# Patient Record
Sex: Female | Born: 1995 | Race: Black or African American | Hispanic: No | Marital: Single | Smoking: Never smoker
Health system: Southern US, Community
[De-identification: ages and names within clinical notes are randomized; demographics above are authoritative.]

## PROBLEM LIST (undated history)

## (undated) ENCOUNTER — Inpatient Hospital Stay (HOSPITAL_COMMUNITY): Payer: Self-pay

## (undated) DIAGNOSIS — O98212 Gonorrhea complicating pregnancy, second trimester: Secondary | ICD-10-CM

## (undated) DIAGNOSIS — J45909 Unspecified asthma, uncomplicated: Secondary | ICD-10-CM

## (undated) DIAGNOSIS — D649 Anemia, unspecified: Secondary | ICD-10-CM

## (undated) HISTORY — PX: WISDOM TOOTH EXTRACTION: SHX21

## (undated) HISTORY — DX: Unspecified asthma, uncomplicated: J45.909

---
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
## 2015-01-16 ENCOUNTER — Ambulatory Visit (INDEPENDENT_AMBULATORY_CARE_PROVIDER_SITE_OTHER): Payer: Medicaid Other

## 2015-01-16 DIAGNOSIS — Z119 Encounter for screening for infectious and parasitic diseases, unspecified: Secondary | ICD-10-CM | POA: Diagnosis not present

## 2015-01-16 NOTE — Progress Notes (Signed)
PPD placed left forearm and Dc'd home with f/up visit for reading.

## 2015-01-19 ENCOUNTER — Ambulatory Visit (INDEPENDENT_AMBULATORY_CARE_PROVIDER_SITE_OTHER): Payer: Medicaid Other

## 2015-01-19 DIAGNOSIS — Z119 Encounter for screening for infectious and parasitic diseases, unspecified: Secondary | ICD-10-CM | POA: Diagnosis not present

## 2015-01-19 LAB — TB SKIN TEST
Induration: 0 mm
TB SKIN TEST: NEGATIVE

## 2015-01-19 NOTE — Progress Notes (Signed)
Pt in for PPD read/ nurse visit only. PPD test read by RN and negative. Pt form filled out by RN and given back to pt. Pt given AVS and left for home with completed form.

## 2015-03-01 ENCOUNTER — Ambulatory Visit: Payer: Medicaid Other | Admitting: Obstetrics

## 2015-03-23 ENCOUNTER — Encounter: Payer: Self-pay | Admitting: Obstetrics

## 2015-03-23 ENCOUNTER — Ambulatory Visit (INDEPENDENT_AMBULATORY_CARE_PROVIDER_SITE_OTHER): Payer: Medicaid Other | Admitting: Obstetrics

## 2015-03-23 VITALS — BP 121/70 | HR 83 | Temp 98.5°F | Ht 61.0 in | Wt 127.0 lb

## 2015-03-23 DIAGNOSIS — Z0000 Encounter for general adult medical examination without abnormal findings: Secondary | ICD-10-CM | POA: Diagnosis not present

## 2015-03-23 DIAGNOSIS — Z01419 Encounter for gynecological examination (general) (routine) without abnormal findings: Secondary | ICD-10-CM

## 2015-03-23 DIAGNOSIS — Z30011 Encounter for initial prescription of contraceptive pills: Secondary | ICD-10-CM

## 2015-03-23 MED ORDER — NORGESTIMATE-ETH ESTRADIOL 0.25-35 MG-MCG PO TABS: 1.0000 | ORAL_TABLET | Freq: Every day | ORAL | Status: DC

## 2015-03-23 NOTE — Patient Instructions (Signed)
Oral Contraception Use Oral contraceptive pills (OCPs) are medicines taken to prevent pregnancy. OCPs work by preventing the ovaries from releasing eggs. The hormones in OCPs also cause the cervical mucus to thicken, preventing the sperm from entering the uterus. The hormones also cause the uterine lining to become thin, not allowing a fertilized egg to attach to the inside of the uterus. OCPs are highly effective when taken exactly as prescribed. However, OCPs do not prevent sexually transmitted diseases (STDs). Safe sex practices, such as using condoms along with an OCP, can help prevent STDs. Before taking OCPs, you may have a physical exam and Pap test. Your health care provider may also order blood tests if necessary. Your health care provider will make sure you are a good candidate for oral contraception. Discuss with your health care provider the possible side effects of the OCP you may be prescribed. When starting an OCP, it can take 2 to 3 months for the body to adjust to the changes in hormone levels in your body.  HOW TO TAKE ORAL CONTRACEPTIVE PILLS Your health care provider may advise you on how to start taking the first cycle of OCPs. Otherwise, you can:   Start on day 1 of your menstrual period. You will not need any backup contraceptive protection with this start time.   Start on the first Sunday after your menstrual period or the day you get your prescription. In these cases, you will need to use backup contraceptive protection for the first week.   Start the pill at any time of your cycle. If you take the pill within 5 days of the start of your period, you are protected against pregnancy right away. In this case, you will not need a backup form of birth control. If you start at any other time of your menstrual cycle, you will need to use another form of birth control for 7 days. If your OCP is the type called a minipill, it will protect you from pregnancy after taking it for 2 days (48  hours). After you have started taking OCPs:   If you forget to take 1 pill, take it as soon as you remember. Take the next pill at the regular time.   If you miss 2 or more pills, call your health care provider because different pills have different instructions for missed doses. Use backup birth control until your next menstrual period starts.   If you use a 28-day pack that contains inactive pills and you miss 1 of the last 7 pills (pills with no hormones), it will not matter. Throw away the rest of the non-hormone pills and start a new pill pack.  No matter which day you start the OCP, you will always start a new pack on that same day of the week. Have an extra pack of OCPs and a backup contraceptive method available in case you miss some pills or lose your OCP pack.  HOME CARE INSTRUCTIONS   Do not smoke.   Always use a condom to protect against STDs. OCPs do not protect against STDs.   Use a calendar to mark your menstrual period days.   Read the information and directions that came with your OCP. Talk to your health care provider if you have questions.  SEEK MEDICAL CARE IF:   You develop nausea and vomiting.   You have abnormal vaginal discharge or bleeding.   You develop a rash.   You miss your menstrual period.   You are losing   your hair.   You need treatment for mood swings or depression.   You get dizzy when taking the OCP.   You develop acne from taking the OCP.   You become pregnant.  SEEK IMMEDIATE MEDICAL CARE IF:   You develop chest pain.   You develop shortness of breath.   You have an uncontrolled or severe headache.   You develop numbness or slurred speech.   You develop visual problems.   You develop pain, redness, and swelling in the legs.  Document Released: 05/30/2011 Document Revised: 10/25/2013 Document Reviewed: 11/29/2012 ExitCare Patient Information 2015 [employer]. This information is not intended to replace  advice given to you by your health care provider. Make sure you discuss any questions you have with your health care provider.  

## 2015-03-23 NOTE — Progress Notes (Signed)
Subjective:        Catherine Golden is a 19 y.o. female here for a routine exam.  Current complaints: none.    Personal health questionnaire:  Is patient Ashkenazi Jewish, have a family history of breast and/or ovarian cancer: no Is there a family history of uterine cancer diagnosed at age < 64, gastrointestinal cancer, urinary tract cancer, family member who is a Personnel officer syndrome-associated carrier: no Is the patient overweight and hypertensive, family history of diabetes, personal history of gestational diabetes, preeclampsia or PCOS: no Is patient over 77, have PCOS,  family history of premature CHD under age 25, diabetes, smoke, have hypertension or peripheral artery disease:  no At any time, has a partner hit, kicked or otherwise hurt or frightened you?: no Over the past 2 weeks, have you felt down, depressed or hopeless?: no Over the past 2 weeks, have you felt little interest or pleasure in doing things?:no   Gynecologic History Patient's last menstrual period was 03/01/2015 (exact date). Contraception: none Last Pap: n/a. Results were: n/a Last mammogram: n/a. Results were: n/a  Obstetric History OB History  Gravida Para Term Preterm AB SAB TAB Ectopic Multiple Living         Past Medical History  Diagnosis Date  . Asthma     Past Surgical History  Procedure Laterality Date  . Wisdom tooth extraction       Current outpatient prescriptions:  .  albuterol (PROVENTIL HFA;VENTOLIN HFA) 108 (90 BASE) MCG/ACT inhaler, Inhale 2 puffs into the lungs every 4 (four) hours as needed for wheezing., Disp: 2 Inhaler, Rfl: 11 .  azithromycin (ZITHROMAX) 500 MG tablet, Take two tablets once (Patient not taking: Reported on 03/23/2015), Disp: 2 tablet, Rfl: 0 .  fluconazole (DIFLUCAN) 150 MG tablet, Take 1 tab (150 mg) today and one tab (150 mg) in 3 days (Patient not taking: Reported on 03/23/2015), Disp: 2 tablet, Rfl: 0 .  norgestimate-ethinyl estradiol  (ORTHO-CYCLEN,SPRINTEC,PREVIFEM) 0.25-35 MG-MCG tablet, Take 1 tablet by mouth daily., Disp: 1 Package, Rfl: 11 No Known Allergies  Social History  Substance Use Topics  . Smoking status: Passive Smoke Exposure - Never Smoker  . Smokeless tobacco: Never Used  . Alcohol Use: No    Family History  Problem Relation Age of Onset  . Hypertension    . Diabetes Mellitus II    . Cystic fibrosis    . Hypertension Father   . Diabetes Paternal Grandmother   . Hypertension Paternal Grandmother   . Cancer Neg Hx       Review of Systems  Constitutional: negative for fatigue and weight loss Respiratory: negative for cough and wheezing Cardiovascular: negative for chest pain, fatigue and palpitations Gastrointestinal: negative for abdominal pain and change in bowel habits Musculoskeletal:negative for myalgias Neurological: negative for gait problems and tremors Behavioral/Psych: negative for abusive relationship, depression Endocrine: negative for temperature intolerance   Genitourinary:negative for abnormal menstrual periods, genital lesions, hot flashes, sexual problems and vaginal discharge Integument/breast: negative for breast lump, breast tenderness, nipple discharge and skin lesion(s)    Objective:       BP 121/70 mmHg  Pulse 83  Temp(Src) 98.5 F (36.9 C)  Ht  (1.549 m)  Wt 127 lb (57.607 kg)  BMI 24.01 kg/m2  LMP 03/01/2015 (Exact Date) General:   alert  Skin:   no rash or abnormalities  Lungs:   clear to auscultation bilaterally  Heart:   regular  rate and rhythm, S1, S2 normal, no murmur, click, rub or gallop  Breasts:   normal without suspicious masses, skin or nipple changes or axillary nodes  Abdomen:  normal findings: no organomegaly, soft, non-tender and no hernia  Pelvis:  External genitalia: normal general appearance Urinary system: urethral meatus normal and bladder without fullness, nontender Vaginal: normal without tenderness, induration or  masses Cervix: normal appearance Adnexa: normal bimanual exam Uterus: anteverted and non-tender, normal size   Lab Review Urine pregnancy test Labs reviewed no Radiologic studies reviewed no    Assessment:    Healthy female exam.    Plan:    Education reviewed: safe sex/STD prevention and weight bearing exercise. Contraception: OCP (estrogen/progesterone). Follow up in: 1 year.   Meds ordered this encounter  Medications  . norgestimate-ethinyl estradiol (ORTHO-CYCLEN,SPRINTEC,PREVIFEM) 0.25-35 MG-MCG tablet    Sig: Take 1 tablet by mouth daily.    Dispense:  1 Package    Refill:  11   Orders Placed This Encounter  Procedures  . SureSwab, Vaginosis/Vaginitis Plus

## 2015-03-24 ENCOUNTER — Encounter: Payer: Self-pay | Admitting: Obstetrics

## 2015-03-27 ENCOUNTER — Other Ambulatory Visit: Payer: Self-pay | Admitting: Obstetrics

## 2015-03-27 DIAGNOSIS — B373 Candidiasis of vulva and vagina: Secondary | ICD-10-CM

## 2015-03-27 DIAGNOSIS — B3731 Acute candidiasis of vulva and vagina: Secondary | ICD-10-CM

## 2015-03-27 LAB — SURESWAB, VAGINOSIS/VAGINITIS PLUS
Atopobium vaginae: NOT DETECTED Log (cells/mL)
C. GLABRATA, DNA: NOT DETECTED
C. PARAPSILOSIS, DNA: NOT DETECTED
C. TRACHOMATIS RNA, TMA: NOT DETECTED
C. TROPICALIS, DNA: NOT DETECTED
C. albicans, DNA: DETECTED — AB
Gardnerella vaginalis: 5.4 Log (cells/mL)
LACTOBACILLUS SPECIES: 7.8 Log (cells/mL)
MEGASPHAERA SPECIES: NOT DETECTED Log (cells/mL)
N. GONORRHOEAE RNA, TMA: NOT DETECTED
T. vaginalis RNA, QL TMA: NOT DETECTED

## 2015-03-27 MED ORDER — FLUCONAZOLE 150 MG PO TABS: 150.0000 mg | ORAL_TABLET | Freq: Once | ORAL | Status: DC

## 2015-04-03 ENCOUNTER — Ambulatory Visit: Payer: Medicaid Other | Admitting: Obstetrics

## 2015-04-04 ENCOUNTER — Ambulatory Visit: Payer: Medicaid Other | Admitting: Obstetrics

## 2015-05-09 ENCOUNTER — Ambulatory Visit: Payer: Medicaid Other | Admitting: Obstetrics

## 2015-05-29 ENCOUNTER — Other Ambulatory Visit (INDEPENDENT_AMBULATORY_CARE_PROVIDER_SITE_OTHER): Payer: Medicaid Other | Admitting: *Deleted

## 2015-05-29 ENCOUNTER — Encounter: Payer: Self-pay | Admitting: Obstetrics

## 2015-05-29 VITALS — BP 119/74 | HR 85 | Wt 125.0 lb

## 2015-05-29 DIAGNOSIS — Z3201 Encounter for pregnancy test, result positive: Secondary | ICD-10-CM | POA: Diagnosis not present

## 2015-05-29 DIAGNOSIS — Z3200 Encounter for pregnancy test, result unknown: Secondary | ICD-10-CM

## 2015-05-29 MED ORDER — VITAFOL GUMMIES 3.33-0.333-34.8 MG PO CHEW: 3.0000 | CHEWABLE_TABLET | Freq: Every day | ORAL | Status: DC

## 2015-06-01 LAB — POCT URINE PREGNANCY: PREG TEST UR: POSITIVE — AB

## 2015-06-01 NOTE — Progress Notes (Signed)
Pt in office for UPT, confirm pregnancy.  Pt states that she had positive at home test. UPT in office is positive.  Pt states her LMP is 04/14/15, dating her at 4557w3d.  Pt made aware of dating and scheduling NOB appt.  Pt made aware that NOB are usually scheduled around 10-12 weeks.  Pt states that she is having some nausea.  Pt was advised to try eating small snack size meals. Pt was advised to contact office if nausea continues, worsens or she begins vomitting.  Pt has been advised of signs and symptoms that [facility]   Pt states understanding.

## 2015-06-25 NOTE — L&D Delivery Note (Signed)
Delivery Note At 7:51 AM a viable female was delivered via Vaginal, Spontaneous Delivery (Presentation: ROA  ).  APGAR: 9, 9; weight 7 lb 5.1 oz (3320 g).   Placenta status: spont , intact .  Cord:  3 vessel Anesthesia:  1% lidocaine Episiotomy: None Lacerations: 1st degree Suture Repair: 3.0 monocryl; 3 interrupted along right labia Est. Blood Loss (mL): 425  Given cytotec PO for some expressed clots; mod bldg. Fundus firm, bladder emptied of 300cc urine, and Pitocin bolus infusing.  Mom to postpartum.  Baby to Couplet care / Skin to Skin.  Cam Hai CNM 01/20/2016, 10:04 AM

## 2015-07-03 ENCOUNTER — Ambulatory Visit (INDEPENDENT_AMBULATORY_CARE_PROVIDER_SITE_OTHER): Payer: Medicaid Other | Admitting: Obstetrics

## 2015-07-03 ENCOUNTER — Encounter: Payer: Self-pay | Admitting: Obstetrics

## 2015-07-03 VITALS — BP 117/73 | HR 87 | Temp 97.9°F | Wt 118.4 lb

## 2015-07-03 DIAGNOSIS — Z3401 Encounter for supervision of normal first pregnancy, first trimester: Secondary | ICD-10-CM

## 2015-07-03 DIAGNOSIS — Z3490 Encounter for supervision of normal pregnancy, unspecified, unspecified trimester: Secondary | ICD-10-CM

## 2015-07-03 LAB — POCT URINALYSIS DIPSTICK
Bilirubin, UA: NEGATIVE
Blood, UA: NEGATIVE
GLUCOSE UA: NEGATIVE
KETONES UA: NEGATIVE
Nitrite, UA: NEGATIVE
Protein, UA: 1
SPEC GRAV UA: 1.02
UROBILINOGEN UA: NEGATIVE
pH, UA: 5

## 2015-07-03 NOTE — Progress Notes (Signed)
Patient has had an increase in discharge

## 2015-07-03 NOTE — Addendum Note (Signed)
Addended by: Elby BeckPAUL, JANE F on: 07/03/2015 10:45 AM   Modules accepted: Orders

## 2015-07-03 NOTE — Progress Notes (Signed)
Subjective:    Catherine Golden is being seen today for her first obstetrical visit.  This is not a planned pregnancy. She is at Unknown gestation. Her obstetrical history is significant for adolescence. Relationship with FOB: significant other, not living together. Patient does intend to breast feed. Pregnancy history fully reviewed.  The information documented in the HPI was reviewed and verified.  Menstrual History: OB History    Gravida Para Term Preterm AB TAB SAB Ectopic Multiple Living   1 0 0 0 0 0 0 0 0 0        Patient's last menstrual period was 04/14/2015 (exact date).    Past Medical History  Diagnosis Date  . Asthma     Past Surgical History  Procedure Laterality Date  . Wisdom tooth extraction       (Not in a hospital admission) No Known Allergies  Social History  Substance Use Topics  . Smoking status: Passive Smoke Exposure - Never Smoker  . Smokeless tobacco: Never Used  . Alcohol Use: No    Family History  Problem Relation Age of Onset  . Hypertension    . Diabetes Mellitus II    . Cystic fibrosis    . Hypertension Father   . Diabetes Paternal Grandmother   . Hypertension Paternal Grandmother   . Cancer Neg Hx      Review of Systems Constitutional: negative for weight loss Gastrointestinal: negative for vomiting Genitourinary:negative for genital lesions and vaginal discharge and dysuria Musculoskeletal:negative for back pain Behavioral/Psych: negative for abusive relationship, depression, illegal drug usage and tobacco use    Objective:    BP 117/73 mmHg  Pulse 87  Temp(Src) 97.9 F (36.6 C)  Wt 118 lb 6.4 oz (53.706 kg)  LMP 04/14/2015 (Exact Date) General Appearance:    Alert, cooperative, no distress, appears stated age  Head:    Normocephalic, without obvious abnormality, atraumatic  Eyes:    PERRL, conjunctiva/corneas clear, EOM's intact, fundi    benign, both eyes  Ears:    Normal TM's and external ear canals, both ears   Nose:   Nares normal, septum midline, mucosa normal, no drainage    or sinus tenderness  Throat:   Lips, mucosa, and tongue normal; teeth and gums normal  Neck:   Supple, symmetrical, trachea midline, no adenopathy;    thyroid:  no enlargement/tenderness/nodules; no carotid   bruit or JVD  Back:     Symmetric, no curvature, ROM normal, no CVA tenderness  Lungs:     Clear to auscultation bilaterally, respirations unlabored  Chest Wall:    No tenderness or deformity   Heart:    Regular rate and rhythm, S1 and S2 normal, no murmur, rub   or gallop  Breast Exam:    No tenderness, masses, or nipple abnormality  Abdomen:     Soft, non-tender, bowel sounds active all four quadrants,    no masses, no organomegaly  Genitalia:    Normal female without lesion, discharge or tenderness  Extremities:   Extremities normal, atraumatic, no cyanosis or edema  Pulses:   2+ and symmetric all extremities  Skin:   Skin color, texture, turgor normal, no rashes or lesions  Lymph nodes:   Cervical, supraclavicular, and axillary nodes normal  Neurologic:   CNII-XII intact, normal strength, sensation and reflexes    throughout      Lab Review Urine pregnancy test Labs reviewed yes Radiologic studies reviewed yes Assessment:    Pregnancy at Unknown weeks  Plan:      Prenatal vitamins.  Counseling provided regarding continued use of seat belts, cessation of alcohol consumption, smoking or use of illicit drugs; infection precautions i.e., influenza/TDAP immunizations, toxoplasmosis,CMV, parvovirus, listeria and varicella; workplace safety, exercise during pregnancy; routine dental care, safe medications, sexual activity, hot tubs, saunas, pools, travel, caffeine use, fish and methlymercury, potential toxins, hair treatments, varicose veins Weight gain recommendations per IOM guidelines reviewed: underweight/BMI< 18.5--> gain 28 - 40 lbs; normal weight/BMI 18.5 - 24.9--> gain 25 - 35 lbs; overweight/BMI 25 -  29.9--> gain 15 - 25 lbs; obese/BMI >30->gain  11 - 20 lbs Problem list reviewed and updated. FIRST/CF mutation testing/NIPT/QUAD SCREEN/fragile X/Ashkenazi Jewish population testing/Spinal muscular atrophy discussed: requested. Role of ultrasound in pregnancy discussed; fetal survey: requested. Amniocentesis discussed: not indicated. VBAC calculator score: VBAC consent form provided No orders of the defined types were placed in this encounter.   Orders Placed This Encounter  Procedures  . Culture, OB Urine  . GC/Chlamydia Probe Amp  . Obstetric panel  . HIV antibody  . Hemoglobinopathy evaluation  . Varicella zoster antibody, IgG  . VITAMIN D 25 Hydroxy (Vit-D Deficiency, Fractures)  . POCT urinalysis dipstick    Follow up in 4 weeks.

## 2015-07-04 ENCOUNTER — Other Ambulatory Visit: Payer: Self-pay | Admitting: *Deleted

## 2015-07-04 ENCOUNTER — Encounter: Payer: Self-pay | Admitting: Obstetrics

## 2015-07-04 DIAGNOSIS — Z3200 Encounter for pregnancy test, result unknown: Secondary | ICD-10-CM

## 2015-07-04 LAB — OBSTETRIC PANEL
Antibody Screen: NEGATIVE
BASOS PCT: 0 % (ref 0–1)
Basophils Absolute: 0 10*3/uL (ref 0.0–0.1)
Eosinophils Absolute: 0 10*3/uL (ref 0.0–0.7)
Eosinophils Relative: 1 % (ref 0–5)
HCT: 32.6 % — ABNORMAL LOW (ref 36.0–46.0)
HEP B S AG: NEGATIVE
Hemoglobin: 11.1 g/dL — ABNORMAL LOW (ref 12.0–15.0)
LYMPHS ABS: 1.1 10*3/uL (ref 0.7–4.0)
LYMPHS PCT: 26 % (ref 12–46)
MCH: 26.2 pg (ref 26.0–34.0)
MCHC: 34 g/dL (ref 30.0–36.0)
MCV: 76.9 fL — ABNORMAL LOW (ref 78.0–100.0)
MONO ABS: 0.3 10*3/uL (ref 0.1–1.0)
MPV: 8.7 fL (ref 8.6–12.4)
Monocytes Relative: 6 % (ref 3–12)
NEUTROS ABS: 2.9 10*3/uL (ref 1.7–7.7)
Neutrophils Relative %: 67 % (ref 43–77)
PLATELETS: 383 10*3/uL (ref 150–400)
RBC: 4.24 MIL/uL (ref 3.87–5.11)
RDW: 20.8 % — AB (ref 11.5–15.5)
Rh Type: POSITIVE
Rubella: 4.42 Index — ABNORMAL HIGH (ref <0.90)
WBC: 4.4 10*3/uL (ref 4.0–10.5)

## 2015-07-04 LAB — VARICELLA ZOSTER ANTIBODY, IGG: Varicella IgG: 171.4 Index — ABNORMAL HIGH (ref <135.00)

## 2015-07-04 LAB — HIV ANTIBODY (ROUTINE TESTING W REFLEX): HIV 1&2 Ab, 4th Generation: NONREACTIVE

## 2015-07-04 LAB — CULTURE, OB URINE
Colony Count: NO GROWTH
Organism ID, Bacteria: NO GROWTH

## 2015-07-04 LAB — VITAMIN D 25 HYDROXY (VIT D DEFICIENCY, FRACTURES): Vit D, 25-Hydroxy: 14 ng/mL — ABNORMAL LOW (ref 30–100)

## 2015-07-04 MED ORDER — VITAFOL GUMMIES 3.33-0.333-34.8 MG PO CHEW
3.0000 | CHEWABLE_TABLET | Freq: Every day | ORAL | Status: DC
Start: 2015-07-04 — End: 2015-09-05

## 2015-07-05 LAB — HEMOGLOBINOPATHY EVALUATION
HGB A: 97.9 % — AB (ref 96.8–97.8)
Hemoglobin Other: 0 %
Hgb A2 Quant: 2.1 % — ABNORMAL LOW (ref 2.2–3.2)
Hgb F Quant: 0 % (ref 0.0–2.0)
Hgb S Quant: 0 %

## 2015-07-05 NOTE — Progress Notes (Signed)
See other encounter.

## 2015-07-07 ENCOUNTER — Other Ambulatory Visit: Payer: Self-pay | Admitting: Obstetrics

## 2015-07-07 DIAGNOSIS — B9689 Other specified bacterial agents as the cause of diseases classified elsewhere: Secondary | ICD-10-CM

## 2015-07-07 DIAGNOSIS — A5602 Chlamydial vulvovaginitis: Secondary | ICD-10-CM

## 2015-07-07 DIAGNOSIS — A5609 Other chlamydial infection of lower genitourinary tract: Principal | ICD-10-CM

## 2015-07-07 DIAGNOSIS — B373 Candidiasis of vulva and vagina: Secondary | ICD-10-CM

## 2015-07-07 DIAGNOSIS — B3731 Acute candidiasis of vulva and vagina: Secondary | ICD-10-CM

## 2015-07-07 DIAGNOSIS — N760 Acute vaginitis: Secondary | ICD-10-CM

## 2015-07-07 LAB — SURESWAB, VAGINOSIS/VAGINITIS PLUS
ATOPOBIUM VAGINAE: 7.2 Log (cells/mL)
BV CATEGORY: UNDETERMINED — AB
C. TROPICALIS, DNA: NOT DETECTED
C. albicans, DNA: DETECTED — AB
C. glabrata, DNA: NOT DETECTED
C. parapsilosis, DNA: NOT DETECTED
C. trachomatis RNA, TMA: DETECTED — AB
Gardnerella vaginalis: >8 Log (cells/mL)
LACTOBACILLUS SPECIES: 5.3 Log (cells/mL)
N. gonorrhoeae RNA, TMA: NOT DETECTED
T. VAGINALIS RNA, QL TMA: NOT DETECTED

## 2015-07-07 MED ORDER — CEFIXIME 400 MG PO TABS: 400.0000 mg | ORAL_TABLET | Freq: Every day | ORAL | Status: DC

## 2015-07-07 MED ORDER — AZITHROMYCIN 250 MG PO TABS: 1000.0000 mg | ORAL_TABLET | Freq: Once | ORAL | Status: DC

## 2015-07-07 MED ORDER — TERCONAZOLE 0.4 % VA CREA: 1.0000 | TOPICAL_CREAM | Freq: Every day | VAGINAL | Status: DC

## 2015-07-07 MED ORDER — METRONIDAZOLE 500 MG PO TABS: 500.0000 mg | ORAL_TABLET | Freq: Two times a day (BID) | ORAL | Status: DC

## 2015-07-12 ENCOUNTER — Other Ambulatory Visit: Payer: Self-pay | Admitting: *Deleted

## 2015-07-12 ENCOUNTER — Telehealth: Payer: Self-pay | Admitting: *Deleted

## 2015-07-12 DIAGNOSIS — B9689 Other specified bacterial agents as the cause of diseases classified elsewhere: Secondary | ICD-10-CM

## 2015-07-12 DIAGNOSIS — N760 Acute vaginitis: Principal | ICD-10-CM

## 2015-07-12 MED ORDER — METRONIDAZOLE 0.75 % VA GEL: 1.0000 | Freq: Every day | VAGINAL | Status: DC

## 2015-07-12 NOTE — Telephone Encounter (Signed)
Patient can not tolerate the metronidazole and she wants to take something else. Will send vaginal treatment for her- patient will finish the yeast treatment-then do the BV treatment.

## 2015-07-31 ENCOUNTER — Ambulatory Visit (INDEPENDENT_AMBULATORY_CARE_PROVIDER_SITE_OTHER): Payer: Medicaid Other | Admitting: Obstetrics

## 2015-07-31 ENCOUNTER — Encounter: Payer: Self-pay | Admitting: Obstetrics

## 2015-07-31 VITALS — BP 146/86 | HR 116 | Temp 98.6°F | Wt 122.0 lb

## 2015-07-31 DIAGNOSIS — Z1389 Encounter for screening for other disorder: Secondary | ICD-10-CM

## 2015-07-31 DIAGNOSIS — Z3402 Encounter for supervision of normal first pregnancy, second trimester: Secondary | ICD-10-CM

## 2015-07-31 DIAGNOSIS — Z331 Pregnant state, incidental: Secondary | ICD-10-CM

## 2015-07-31 LAB — POCT URINALYSIS DIPSTICK
BILIRUBIN UA: NEGATIVE
Blood, UA: NEGATIVE
Glucose, UA: NEGATIVE
KETONES UA: NEGATIVE
LEUKOCYTES UA: NEGATIVE
Nitrite, UA: NEGATIVE
PROTEIN UA: NEGATIVE
SPEC GRAV UA: 1.02
Urobilinogen, UA: NEGATIVE
pH, UA: 5

## 2015-07-31 NOTE — Addendum Note (Signed)
Addended by: Coral Ceo A on: 07/31/2015 02:56 PM   Modules accepted: Orders

## 2015-07-31 NOTE — Progress Notes (Signed)
  Subjective:    TYTEANNA OST is a 20 y.o. female being seen today for her obstetrical visit. She is at [redacted]w[redacted]d gestation. Patient reports: no complaints.  Problem List Items Addressed This Visit    None    Visit Diagnoses    Encounter for supervision of normal first pregnancy in second trimester    -  Primary    Relevant Orders    POCT urinalysis dipstick    US OB Comp + 14 Wk    AFP, Quad Screen      Patient Active Problem List   Diagnosis Date Noted  . Vaginal irritation 11/29/2013  . Asthma, mild intermittent, well-controlled 09/07/2013    Objective:     BP 146/86 mmHg  Pulse 116  Temp(Src) 98.6 F (37 C)  Wt 122 lb (55.339 kg)  LMP 04/14/2015 (Exact Date) Uterine Size: Below umbilicus     Assessment:    Pregnancy @ [redacted]w[redacted]d  weeks Doing well    Plan:    Problem list reviewed and updated. Labs reviewed.  Follow up in 4 weeks. FIRST/CF mutation testing/NIPT/QUAD SCREEN/fragile X/Ashkenazi Jewish population testing/Spinal muscular atrophy discussed: requested. Role of ultrasound in pregnancy discussed; fetal survey: requested. Amniocentesis discussed: not indicated.

## 2015-08-03 LAB — AFP, QUAD SCREEN
AFP: 63.6 ng/mL
CURR GEST AGE: 15.3 wks.days
HCG, Total: 82.71 IU/mL
INH: 276.8 pg/mL
INTERPRETATION-AFP: NEGATIVE
MoM for AFP: 1.61
MoM for INH: 1.42
MoM for hCG: 1.43
Open Spina bifida: NEGATIVE
TRI 18 SCR RISK EST: NEGATIVE
UE3 MOM: 0.86
uE3 Value: 0.64 ng/mL

## 2015-08-31 ENCOUNTER — Ambulatory Visit (INDEPENDENT_AMBULATORY_CARE_PROVIDER_SITE_OTHER): Payer: Medicaid Other

## 2015-08-31 ENCOUNTER — Other Ambulatory Visit: Payer: Self-pay | Admitting: Obstetrics

## 2015-08-31 ENCOUNTER — Encounter: Payer: Self-pay | Admitting: Obstetrics

## 2015-08-31 ENCOUNTER — Ambulatory Visit (INDEPENDENT_AMBULATORY_CARE_PROVIDER_SITE_OTHER): Payer: Medicaid Other | Admitting: Obstetrics

## 2015-08-31 VITALS — BP 119/72 | HR 98 | Temp 97.8°F | Wt 125.0 lb

## 2015-08-31 DIAGNOSIS — Z36 Encounter for antenatal screening of mother: Secondary | ICD-10-CM | POA: Diagnosis not present

## 2015-08-31 DIAGNOSIS — Z0375 Encounter for suspected cervical shortening ruled out: Secondary | ICD-10-CM

## 2015-08-31 DIAGNOSIS — Z3402 Encounter for supervision of normal first pregnancy, second trimester: Secondary | ICD-10-CM

## 2015-08-31 LAB — POCT URINALYSIS DIPSTICK
Bilirubin, UA: NEGATIVE
Blood, UA: NEGATIVE
Glucose, UA: NEGATIVE
KETONES UA: NEGATIVE
Nitrite, UA: NEGATIVE
PH UA: 6
PROTEIN UA: NEGATIVE
SPEC GRAV UA: 1.015
Urobilinogen, UA: NEGATIVE

## 2015-08-31 NOTE — Progress Notes (Signed)
Subjective:    Catherine Golden is a 20 y.o. female being seen today for her obstetrical visit. She is at 329w6d gestation. Patient reports: vaginal irritation . Fetal movement: normal.  Problem List Items Addressed This Visit    None    Visit Diagnoses    Encounter for supervision of normal first pregnancy in second trimester    -  Primary    Relevant Orders    POCT urinalysis dipstick (Completed)      Patient Active Problem List   Diagnosis Date Noted  . Vaginal irritation 11/29/2013  . Asthma, mild intermittent, well-controlled 09/07/2013   Objective:    BP 119/72 mmHg  Pulse 98  Temp(Src) 97.8 F (36.6 C)  Wt 125 lb (56.7 kg)  LMP 04/14/2015 (Exact Date) FHT: 160 BPM  Uterine Size: size equals dates     Assessment:    Pregnancy @ 4529w6d    Plan:    Signs and symptoms of preterm labor: discussed.  Labs, problem list reviewed and updated 2 hr GTT planned Follow up in 2 weeks.

## 2015-09-01 ENCOUNTER — Encounter: Payer: Self-pay | Admitting: Obstetrics

## 2015-09-05 ENCOUNTER — Other Ambulatory Visit: Payer: Self-pay | Admitting: *Deleted

## 2015-09-05 DIAGNOSIS — Z3402 Encounter for supervision of normal first pregnancy, second trimester: Secondary | ICD-10-CM

## 2015-09-05 MED ORDER — VITAFOL ULTRA 29-0.6-0.4-200 MG PO CAPS: 1.0000 | ORAL_CAPSULE | Freq: Every day | ORAL | Status: DC

## 2015-09-14 ENCOUNTER — Encounter: Payer: Self-pay | Admitting: Obstetrics

## 2015-09-14 ENCOUNTER — Ambulatory Visit (INDEPENDENT_AMBULATORY_CARE_PROVIDER_SITE_OTHER): Payer: Medicaid Other | Admitting: Obstetrics

## 2015-09-14 VITALS — BP 122/69 | HR 101 | Temp 98.9°F | Wt 127.0 lb

## 2015-09-14 DIAGNOSIS — Z3402 Encounter for supervision of normal first pregnancy, second trimester: Secondary | ICD-10-CM

## 2015-09-14 DIAGNOSIS — Z3689 Encounter for other specified antenatal screening: Secondary | ICD-10-CM

## 2015-09-14 DIAGNOSIS — O26872 Cervical shortening, second trimester: Secondary | ICD-10-CM

## 2015-09-14 LAB — POCT URINALYSIS DIPSTICK
BILIRUBIN UA: NEGATIVE
Glucose, UA: NEGATIVE
KETONES UA: NEGATIVE
Leukocytes, UA: NEGATIVE
Nitrite, UA: NEGATIVE
PH UA: 6
RBC UA: NEGATIVE
SPEC GRAV UA: 1.025
Urobilinogen, UA: NEGATIVE

## 2015-09-14 NOTE — Progress Notes (Signed)
Subjective:    Catherine Golden is a 20 y.o. female being seen today for her obstetrical visit. She is at [redacted]w[redacted]d gestation. Patient reports: no complaints . Fetal movement: normal.  Problem List Items Addressed This Visit    None    Visit Diagnoses    Encounter for supervision of normal first pregnancy in second trimester    -  Primary    Relevant Orders    POCT urinalysis dipstick (Completed)    NuSwab Vaginitis Plus (VG+)    Encounter for fetal anatomic survey        Relevant Orders    US MFM OB COMP + 14 WK    US MFM OB Transvaginal    Cervical shortening affecting pregnancy in second trimester, antepartum        Relevant Orders    US MFM OB COMP + 14 WK    US MFM OB Transvaginal      Patient Active Problem List   Diagnosis Date Noted  . Vaginal irritation 11/29/2013  . Asthma, mild intermittent, well-controlled 09/07/2013   Objective:    BP 122/69 mmHg  Pulse 101  Temp(Src) 98.9 F (37.2 C)  Wt 127 lb (57.607 kg)  LMP 04/14/2015 (Exact Date) FHT: 150 BPM  Uterine Size: size equals dates     Assessment:    Pregnancy @ 119w6d     Shortened, dynamic cervix with funneling on U/S.  Plan:   F/U evaluation scheduled with MFM.   Signs and symptoms of preterm labor: discussed.  Labs, problem list reviewed and updated 2 hr GTT planned Follow up in 4 weeks.

## 2015-09-17 LAB — NUSWAB VAGINITIS PLUS (VG+)
Candida albicans, NAA: NEGATIVE
Candida glabrata, NAA: NEGATIVE
Chlamydia trachomatis, NAA: NEGATIVE
NEISSERIA GONORRHOEAE, NAA: NEGATIVE
Trich vag by NAA: NEGATIVE

## 2015-09-22 ENCOUNTER — Ambulatory Visit (HOSPITAL_COMMUNITY): Payer: Medicaid Other

## 2015-09-25 ENCOUNTER — Other Ambulatory Visit: Payer: Self-pay | Admitting: Obstetrics

## 2015-09-25 ENCOUNTER — Encounter (HOSPITAL_COMMUNITY): Payer: Self-pay

## 2015-09-25 ENCOUNTER — Inpatient Hospital Stay (HOSPITAL_COMMUNITY)
Admission: AD | Admit: 2015-09-25 | Discharge: 2015-11-24 | DRG: 781 | Disposition: A | Discharge status: Home / Self Care | Payer: Medicaid Other | Source: Ambulatory Visit | Attending: Obstetrics | Admitting: Obstetrics

## 2015-09-25 ENCOUNTER — Encounter (HOSPITAL_COMMUNITY): Payer: Self-pay | Admitting: Anesthesiology

## 2015-09-25 ENCOUNTER — Encounter (HOSPITAL_COMMUNITY): Payer: Self-pay | Admitting: *Deleted

## 2015-09-25 ENCOUNTER — Ambulatory Visit (HOSPITAL_COMMUNITY)
Admission: RE | Admit: 2015-09-25 | Discharge: 2015-09-25 | Disposition: A | Discharge status: Home / Self Care | Payer: Medicaid Other | Source: Ambulatory Visit | Attending: Obstetrics | Admitting: Obstetrics

## 2015-09-25 DIAGNOSIS — Z3A25 25 weeks gestation of pregnancy: Secondary | ICD-10-CM

## 2015-09-25 DIAGNOSIS — O3433 Maternal care for cervical incompetence, third trimester: Secondary | ICD-10-CM

## 2015-09-25 DIAGNOSIS — O3442 Maternal care for other abnormalities of cervix, second trimester: Secondary | ICD-10-CM

## 2015-09-25 DIAGNOSIS — Z3A23 23 weeks gestation of pregnancy: Secondary | ICD-10-CM | POA: Insufficient documentation

## 2015-09-25 DIAGNOSIS — Z36 Encounter for antenatal screening of mother: Secondary | ICD-10-CM | POA: Insufficient documentation

## 2015-09-25 DIAGNOSIS — J45909 Unspecified asthma, uncomplicated: Secondary | ICD-10-CM | POA: Diagnosis present

## 2015-09-25 DIAGNOSIS — O99512 Diseases of the respiratory system complicating pregnancy, second trimester: Secondary | ICD-10-CM | POA: Diagnosis present

## 2015-09-25 DIAGNOSIS — O26872 Cervical shortening, second trimester: Principal | ICD-10-CM

## 2015-09-25 DIAGNOSIS — O26873 Cervical shortening, third trimester: Secondary | ICD-10-CM

## 2015-09-25 DIAGNOSIS — O3432 Maternal care for cervical incompetence, second trimester: Secondary | ICD-10-CM

## 2015-09-25 DIAGNOSIS — D509 Iron deficiency anemia, unspecified: Secondary | ICD-10-CM | POA: Diagnosis not present

## 2015-09-25 DIAGNOSIS — R000 Tachycardia, unspecified: Secondary | ICD-10-CM | POA: Diagnosis present

## 2015-09-25 DIAGNOSIS — O99012 Anemia complicating pregnancy, second trimester: Secondary | ICD-10-CM | POA: Diagnosis present

## 2015-09-25 DIAGNOSIS — Z3689 Encounter for other specified antenatal screening: Secondary | ICD-10-CM

## 2015-09-25 DIAGNOSIS — Z833 Family history of diabetes mellitus: Secondary | ICD-10-CM | POA: Diagnosis not present

## 2015-09-25 DIAGNOSIS — O2242 Hemorrhoids in pregnancy, second trimester: Secondary | ICD-10-CM | POA: Diagnosis present

## 2015-09-25 DIAGNOSIS — Z8249 Family history of ischemic heart disease and other diseases of the circulatory system: Secondary | ICD-10-CM | POA: Diagnosis not present

## 2015-09-25 DIAGNOSIS — Z3A28 28 weeks gestation of pregnancy: Secondary | ICD-10-CM

## 2015-09-25 LAB — CBC
HEMATOCRIT: 24.6 % — AB (ref 36.0–46.0)
Hemoglobin: 8.1 g/dL — ABNORMAL LOW (ref 12.0–15.0)
MCH: 25.2 pg — ABNORMAL LOW (ref 26.0–34.0)
MCHC: 32.9 g/dL (ref 30.0–36.0)
MCV: 76.6 fL — ABNORMAL LOW (ref 78.0–100.0)
PLATELETS: 348 10*3/uL (ref 150–400)
RBC: 3.21 MIL/uL — ABNORMAL LOW (ref 3.87–5.11)
RDW: 15 % (ref 11.5–15.5)
WBC: 11.2 10*3/uL — AB (ref 4.0–10.5)

## 2015-09-25 LAB — WET PREP, GENITAL
Clue Cells Wet Prep HPF POC: NONE SEEN
SPERM: NONE SEEN
TRICH WET PREP: NONE SEEN
YEAST WET PREP: NONE SEEN

## 2015-09-25 LAB — TYPE AND SCREEN
ABO/RH(D): B POS
ANTIBODY SCREEN: NEGATIVE

## 2015-09-25 LAB — ABO/RH: ABO/RH(D): B POS

## 2015-09-25 LAB — GROUP B STREP BY PCR: GROUP B STREP BY PCR: NEGATIVE

## 2015-09-25 MED ORDER — PROGESTERONE MICRONIZED 200 MG PO CAPS
200.0000 mg | ORAL_CAPSULE | Freq: Every day | ORAL | Status: DC
  Administered 2015-09-25 – 2015-11-23 (×59): 200 mg via VAGINAL
  Filled 2015-09-25 (×59): qty 1

## 2015-09-25 MED ORDER — ACETAMINOPHEN 325 MG PO TABS
650.0000 mg | ORAL_TABLET | ORAL | Status: DC | PRN
  Administered 2015-10-16 – 2015-11-20 (×2): 650 mg via ORAL
  Filled 2015-09-25 (×2): qty 2

## 2015-09-25 MED ORDER — PROMETHAZINE HCL 25 MG PO TABS
25.0000 mg | ORAL_TABLET | Freq: Four times a day (QID) | ORAL | Status: DC | PRN
  Administered 2015-09-25 – 2015-11-22 (×2): 25 mg via ORAL
  Filled 2015-09-25 (×2): qty 1

## 2015-09-25 MED ORDER — POLYSACCHARIDE IRON COMPLEX 150 MG PO CAPS
150.0000 mg | ORAL_CAPSULE | Freq: Every day | ORAL | Status: DC
  Administered 2015-09-25 – 2015-10-24 (×30): 150 mg via ORAL
  Filled 2015-09-25 (×29): qty 1

## 2015-09-25 MED ORDER — SODIUM CHLORIDE 0.9 % IV SOLN: 250.0000 mL | INTRAVENOUS | Status: DC | PRN

## 2015-09-25 MED ORDER — MAGNESIUM SULFATE 50 % IJ SOLN
2.0000 g/h | INTRAVENOUS | Status: DC
  Administered 2015-09-26: 2 g/h via INTRAVENOUS
  Filled 2015-09-25 (×2): qty 80

## 2015-09-25 MED ORDER — MAGNESIUM HYDROXIDE 400 MG/5ML PO SUSP: 30.0000 mL | Freq: Every evening | ORAL | Status: DC | PRN

## 2015-09-25 MED ORDER — SODIUM CHLORIDE 0.9% FLUSH
3.0000 mL | INTRAVENOUS | Status: DC | PRN
  Administered 2015-10-05 – 2015-10-12 (×2): 3 mL via INTRAVENOUS
  Filled 2015-09-25 (×2): qty 3

## 2015-09-25 MED ORDER — BETAMETHASONE SOD PHOS & ACET 6 (3-3) MG/ML IJ SUSP
12.0000 mg | INTRAMUSCULAR | Status: AC
  Administered 2015-09-25 – 2015-09-26 (×2): 12 mg via INTRAMUSCULAR
  Filled 2015-09-25 (×2): qty 2

## 2015-09-25 MED ORDER — ZOLPIDEM TARTRATE 5 MG PO TABS
5.0000 mg | ORAL_TABLET | Freq: Every evening | ORAL | Status: DC | PRN
  Administered 2015-09-26 – 2015-11-22 (×3): 5 mg via ORAL
  Filled 2015-09-25 (×4): qty 1

## 2015-09-25 MED ORDER — LACTATED RINGERS IV SOLN
INTRAVENOUS | Status: DC
  Administered 2015-09-25: 14:00:00 via INTRAVENOUS
  Administered 2015-09-25: 100 mL/h via INTRAVENOUS
  Administered 2015-09-26: 12:00:00 via INTRAVENOUS

## 2015-09-25 MED ORDER — CALCIUM CARBONATE ANTACID 500 MG PO CHEW
2.0000 | CHEWABLE_TABLET | ORAL | Status: DC | PRN
  Administered 2015-09-30 (×2): 400 mg via ORAL
  Filled 2015-09-25 (×2): qty 2

## 2015-09-25 MED ORDER — PRENATAL MULTIVITAMIN CH
1.0000 | ORAL_TABLET | Freq: Every day | ORAL | Status: DC
  Administered 2015-09-25: 1 via ORAL
  Filled 2015-09-25: qty 1

## 2015-09-25 MED ORDER — SODIUM CHLORIDE 0.9% FLUSH
3.0000 mL | Freq: Two times a day (BID) | INTRAVENOUS | Status: DC
  Administered 2015-09-26 – 2015-10-21 (×45): 3 mL via INTRAVENOUS

## 2015-09-25 MED ORDER — COMPLETENATE 29-1 MG PO CHEW
1.0000 | CHEWABLE_TABLET | Freq: Every day | ORAL | Status: DC
  Administered 2015-09-26 – 2015-10-20 (×25): 1 via ORAL
  Filled 2015-09-25 (×27): qty 1

## 2015-09-25 MED ORDER — DOCUSATE SODIUM 100 MG PO CAPS
100.0000 mg | ORAL_CAPSULE | Freq: Every day | ORAL | Status: DC
  Administered 2015-09-25: 100 mg via ORAL
  Filled 2015-09-25 (×2): qty 1

## 2015-09-25 MED ORDER — ALBUTEROL SULFATE (2.5 MG/3ML) 0.083% IN NEBU: 3.0000 mL | INHALATION_SOLUTION | RESPIRATORY_TRACT | Status: DC | PRN

## 2015-09-25 MED ORDER — METRONIDAZOLE 500 MG PO TABS
500.0000 mg | ORAL_TABLET | Freq: Three times a day (TID) | ORAL | Status: DC
  Administered 2015-09-25 – 2015-09-26 (×3): 500 mg via ORAL
  Filled 2015-09-25 (×4): qty 1

## 2015-09-25 MED ORDER — MAGNESIUM SULFATE BOLUS VIA INFUSION
4.0000 g | Freq: Once | INTRAVENOUS | Status: AC
  Administered 2015-09-25: 4 g via INTRAVENOUS
  Filled 2015-09-25: qty 500

## 2015-09-25 MED ORDER — AZITHROMYCIN 250 MG PO TABS
500.0000 mg | ORAL_TABLET | Freq: Every day | ORAL | Status: AC
  Administered 2015-09-25 – 2015-10-01 (×7): 500 mg via ORAL
  Filled 2015-09-25 (×8): qty 2

## 2015-09-25 MED ORDER — DOCUSATE SODIUM 100 MG PO CAPS
100.0000 mg | ORAL_CAPSULE | Freq: Two times a day (BID) | ORAL | Status: DC
  Administered 2015-09-25 – 2015-11-23 (×112): 100 mg via ORAL
  Filled 2015-09-25 (×114): qty 1

## 2015-09-25 NOTE — Consult Note (Signed)
[facility]  Consultation Service: Neonatology   Dr. Jodi Mourning has asked for consultation on Ms. Apison, QIH#[id number] regarding the care of a premature infant at 77 3/7 weeks. Thank you for inviting [name] to see this patient.   Reason for consult:  Explain the possible complications, the prognosis, and the care of a premature infant at 37 3/7 weeks.  Chief complaint: Ms Archambeau is a 20yo female with a 23 3/7 IUP with an estimated weight of 1 lb.   My key findings of this patient's HPI are:  I have reviewed the patient's chart and have met with her. The salient information is as follows: Ms. Adelene Amas is a 20yo G1 female who was noted to have a short cervix on screening.  Today on follow up, her cervix was <1cm.  She denies ROM or contractions. MFM referral has been made.  No other prenatal complications noted.  She has been started on IV mag and BTMZ.    Prenatal labs:  ABO, Rh: B/POS/-- (01/09 1031)  Antibody: NEG (01/09 1031)  Rubella: 4.42 (01/09 1031)  RPR: NON REAC (01/09 1031)   HBsAg: NEGATIVE (01/09 1031)   HIV: NONREACTIVE (01/09 1031)   GBS: unknown  Prenatal care:   good Pregnancy complications:  shorten cervix Maternal antibiotics: This patient's mother is not on file. Maternal Steroids: yes   Most recent dose:  09/25/15 at 35        My recommendations for this patient and my actions included:   1. In the presence of the patient, her mother and FOB's mother, I spent 25 minutes discussing the possible complications and outcomes of prematurity at this gestational age. I gave the patient a [employer] of [employer] handout, written in lay language, that discussed the common complications and survival data of the premature infant and a summary handout with graph and table. I discussed the potential need for resuscitation at birth, mechanical ventilation and surfactant administration for respiratory distress, IV fluids  pending establishment of enteral feeds (encouraged breast milk feeding to which she planned to do), antibiotics for possible sepsis, temperature support, and monitoring. I also discussed the potential risk of complications such as intracranial hemorrhage, retinopathy, hearing deficit, and chronic lung disease. I also discussed the potential length of stay in the neonatal intensive care unit for about 17 weeks. I discussed this with parents in detail and they expressed an understanding of the risks and complications of prematurity.   2. I also discussed the expected survival of an infant born at 40 3/7, which is near 18%. We further discussed that roughly 91% of neonates born at this age have severe neurological complications and that around 95% have school difficulties. She expressed an understanding of this information.   3. I informed her that the NICU team would be present at the delivery. She agreed that all appropriate medical measures could be taken to resuscitate her infant at the delivery. She also understood that depending on her infant's initial condition and NICU course, some difficult decisions may have to be made.   4. A visit to the NICU by the infant's mother and/or a significant other was not discussed at this time.    Final Impression:  20yo G1 female with a 23 3/7 weeks IUP who is threatening to deliver and who now understands the possible complications and prognosis of her infant.   ______________________________________________________________________  Thank you for asking [name] to participate in the care of this patient.  Please do not hesitate to contact us again if you are aware of any further ways we can be of assistance.   Sincerely,  Monia Sabal. Katherina Mires, MD Neonatologist  I spent ~40 minutes in consultation time, of which 25 minutes was spent in direct face to face counseling.

## 2015-09-25 NOTE — H&P (Signed)
Catherine Golden is a 20 y.o. female presenting for Follow up ultrasound for cervical shortening on previous ultrasound. Maternal Medical History:  Reason for admission: Patient had ultrasound on 08-31-15 which revealed a shortened, ~2.5 cm cervix with slight funneling.  Recommended repeat ultrasound in 2-3 weeks. Ultrasound today shows cervix < 1cm and prominent funneling and dynamic changes.    Fetal activity: Perceived fetal activity is normal.   Last perceived fetal movement was within the past hour.    Prenatal complications: no prenatal complications Prenatal Complications - Diabetes: none.    OB History    Gravida Para Term Preterm AB TAB SAB Ectopic Multiple Living   1 0 0 0 0 0 0 0 0 0      Past Medical History  Diagnosis Date  . Asthma    Past Surgical History  Procedure Laterality Date  . Wisdom tooth extraction     Family History: family history includes Cystic fibrosis in her paternal grandfather; Diabetes in her paternal grandmother; Diabetes Mellitus II in her paternal grandfather; Hypertension in her father, paternal grandfather, and paternal grandmother. There is no history of Cancer. Social History:  reports that she has been passively smoking.  She has never used smokeless tobacco. She reports that she does not drink alcohol or use illicit drugs.   Prenatal Transfer Tool  Maternal Diabetes: No Genetic Screening: Normal Maternal Ultrasounds/Referrals: Abnormal:  Findings:   Other:  Cervical shortening Fetal Ultrasounds or other Referrals:  Referred to Materal Fetal Medicine  Maternal Substance Abuse:  No Significant Maternal Medications:  None Significant Maternal Lab Results:  None Other Comments:  None  Review of Systems  All other systems reviewed and are negative.     Last menstrual period 04/14/2015. Maternal Exam:  Abdomen: Patient reports no abdominal tenderness.   Physical Exam  Nursing note and vitals reviewed. Constitutional: She is  oriented to person, place, and time. She appears well-developed and well-nourished.  HENT:  Head: Normocephalic and atraumatic.  Eyes: Conjunctivae are normal. Pupils are equal, round, and reactive to light.  Neck: Normal range of motion. Neck supple.  Cardiovascular: Normal rate and regular rhythm.   Respiratory: Effort normal and breath sounds normal.  GI: Soft. Bowel sounds are normal.  Genitourinary: Vagina normal and uterus normal.  Musculoskeletal: Normal range of motion.  Neurological: She is alert and oriented to person, place, and time.  Skin: Skin is warm and dry.  Psychiatric: She has a normal mood and affect. Her behavior is normal. Judgment and thought content normal.    Prenatal labs: ABO, Rh: B/POS/-- (01/09 1031) Antibody: NEG (01/09 1031) Rubella: 4.42 (01/09 1031) RPR: NON REAC (01/09 1031)  HBsAg: NEGATIVE (01/09 1031)  HIV: NONREACTIVE (01/09 1031)  GBS:     Assessment/Plan: 20 yo G1 with progressive cervical shortening, funneling and dynamic changes on serial ultrasounds.  Admission, bedrest, steroids and magnesium sulfate recommended by MFM, Dr. Sherrie Georgeecker, after ultrasound today.  The patient has no known risk factors for cervical incompetence.   Lovel Suazo A 09/25/2015, 12:32 PM

## 2015-09-25 NOTE — Progress Notes (Signed)
It was handed off in report that PT's boyfriend was in the bed and being inappropriate, and that the RN would come in and he would jump up and his pants were handing down, would have an erection.  It was very inappropriate, and unsafe for the pt.    During initial assessment, it was explained to the pt that there cannot be anyone in the bed.  Pt stated that she did not really understand what was going on with her.  That in her condition, her baby is viable but has a poor chance of survival.  If the baby does survive being born this early it will be a difficult and a long road.  It was explained that there cannot be penetration, stimulation, or excitement that, that could send her into labor.  The boyfriend came in during the 2100 assessment and started yelling at the RN.  The boyfriend yelled, "Who the fuck said I cannot be in the bed with her?"  It was attempted to be explained to him that it is policy and unsafe for the patient and the baby.  The BF stated he wanted to talk to the Manager and he didn't want to talk to me.  RN left the room and came to the nurses station to talk to the Charge about the situation.  The BF came out of the room and started getting loud at the nurses station.  It was explained to him again the situation of the pt and the baby.  The BF started what about my feeling.  It was explained what was handed off in report and the RN wanted to ensure that everyone understood the fragile situation of the pt and the baby.  He started yelling, and another RN stepped in and explained again.  Explaining that our priority is the pt and the baby.  We will take his feelings into consideration, but we have to ensure the pt and the baby are taken care of.  He's claiming he was being harassed and wanted to talk to an Technical sales engineerfficer.  Security was called and so was the [employer]ber[employer] Supervisor.  They took over the situation.

## 2015-09-25 NOTE — ED Notes (Signed)
Report called to Eunice Blaseebbie, Charity fundraiserN.  Pt to registration, will to room 153 for admission.

## 2015-09-26 LAB — CULTURE, OB URINE
CULTURE: NO GROWTH
Special Requests: NORMAL

## 2015-09-26 LAB — HIV ANTIBODY (ROUTINE TESTING W REFLEX): HIV SCREEN 4TH GENERATION: NONREACTIVE

## 2015-09-26 LAB — GC/CHLAMYDIA PROBE AMP (~~LOC~~) NOT AT ARMC
Chlamydia: NEGATIVE
NEISSERIA GONORRHEA: POSITIVE — AB

## 2015-09-26 MED ORDER — SODIUM CHLORIDE 0.9 % IV SOLN
2.0000 g | Freq: Four times a day (QID) | INTRAVENOUS | Status: DC
  Administered 2015-09-26 – 2015-09-27 (×3): 2 g via INTRAVENOUS
  Filled 2015-09-26 (×5): qty 2000

## 2015-09-26 MED ORDER — CEFTRIAXONE SODIUM 250 MG IJ SOLR
250.0000 mg | Freq: Once | INTRAMUSCULAR | Status: AC
  Administered 2015-09-26: 250 mg via INTRAMUSCULAR
  Filled 2015-09-26: qty 250

## 2015-09-26 MED ORDER — AMOXICILLIN 500 MG PO CAPS: 500.0000 mg | ORAL_CAPSULE | Freq: Three times a day (TID) | ORAL | Status: DC

## 2015-09-26 NOTE — Progress Notes (Signed)
Patient ID: Catherine Golden, female   DOB: Nov 03, 1995, 20 y.o.   MRN: [id number]  Hospital Day: 2  HPI: admitted on 09/25/15 d/t shortened cervix via US. BMZ injections started.  Magnesium drip started for neuroprotection per protocol.  Bedrest with bathroom privlidges. +GC 09/26/15 Dr. Clearance CootsHarper notified.       S: Preterm labor symptoms: none.    O: Blood pressure 96/41, pulse 108, temperature 98.9 F (37.2 C), temperature source Oral, resp. rate 18, height 5' (1.524 m), weight 129 lb (58.514 kg), last menstrual period 04/14/2015, SpO2 100 %.   VOZ:DGUYQIHKFHT:Baseline: 140 bpm, Variability: Good {> 6 bpm), Accelerations: Reactive and Decelerations: Absent Toco: irregular SVE:   A/P- 20 y.o. admitted with: shortened cervix, with threatned preterm labor  Present on Admission:  . Preterm labor  Pregnancy Complications: shortened cervix  Preterm labor management: modified bedrest advised and BMZ, Mag drip Dating:  6236w4d PNL Needed:  none FWB:  good PTL:  None present at this time

## 2015-09-26 NOTE — Progress Notes (Signed)
[facility], the RN went into the room.  The RN again explained to the pt and the boyfriend how important it is that "we" (the treatment team, pt, and bf) keep her pregnant.  The RN used the situation as an Public house managereducational moment.  The RN explained the TOCO monitoring, showed the "mild contractions" and the uterine irritability.  Explained to the bf that though she is not feeling them, they are there, and is putting her and the baby in danger.  Explained that keeping her relaxed and calm, no stimulation is what is best for the pt and the future health of baby.  Educated them on the size, development, and viability of the baby.  Explained that could happen if the baby was born now, and how hard it would be for the baby to survive, and it could have lasting detrimental effects on the baby.  Educated on if the pt was not here what could happen, and how things could turn out without the medication.  Educated on what the magnesium was doing to her body and how it was keeping her pregnant.  Stressed to the parents that nothing needs to enter the vagina because new bacteria does not need to be introduced.  Educated that the cervix was thin, that it will cause infection and cause harm to the baby.  When inserting the Progesterone capsule, "SHE" needs to wear gloves.  During the doppler, the RN had the boyfriend come and hold the doppler machine and had him find the heartbeat.  Both the bf and the pt cried, at that time.  Bf stated he was very grateful for the education, because he didn't completely understand anything that was going on, and was happy that someone took the time to explain it to them in a way they could understand.

## 2015-09-27 LAB — COMPREHENSIVE METABOLIC PANEL
ALBUMIN: 3.3 g/dL — AB (ref 3.5–5.0)
ALK PHOS: 72 U/L (ref 38–126)
ALT: 12 U/L — ABNORMAL LOW (ref 14–54)
ANION GAP: 7 (ref 5–15)
AST: 22 U/L (ref 15–41)
BILIRUBIN TOTAL: 0.1 mg/dL — AB (ref 0.3–1.2)
BUN: <5 mg/dL — ABNORMAL LOW (ref 6–20)
CALCIUM: 8.8 mg/dL — AB (ref 8.9–10.3)
CO2: 24 mmol/L (ref 22–32)
Chloride: 106 mmol/L (ref 101–111)
Creatinine, Ser: 0.3 mg/dL — ABNORMAL LOW (ref 0.44–1.00)
GFR calc non Af Amer: >60 mL/min (ref >60)
GLUCOSE: 101 mg/dL — AB (ref 65–99)
Potassium: 3.5 mmol/L (ref 3.5–5.1)
Sodium: 137 mmol/L (ref 135–145)
TOTAL PROTEIN: 6.8 g/dL (ref 6.5–8.1)

## 2015-09-27 LAB — CBC WITH DIFFERENTIAL/PLATELET
BASOS ABS: 0 10*3/uL (ref 0.0–0.1)
BASOS PCT: 0 %
EOS ABS: 0 10*3/uL (ref 0.0–0.7)
EOS PCT: 0 %
HCT: 20.9 % — ABNORMAL LOW (ref 36.0–46.0)
Hemoglobin: 6.8 g/dL — CL (ref 12.0–15.0)
Lymphocytes Relative: 12 %
Lymphs Abs: 2 10*3/uL (ref 0.7–4.0)
MCH: 25.1 pg — ABNORMAL LOW (ref 26.0–34.0)
MCHC: 32.5 g/dL (ref 30.0–36.0)
MCV: 77.1 fL — AB (ref 78.0–100.0)
MONO ABS: 1.1 10*3/uL — AB (ref 0.1–1.0)
Monocytes Relative: 7 %
NEUTROS ABS: 13.2 10*3/uL — AB (ref 1.7–7.7)
Neutrophils Relative %: 81 %
PLATELETS: 326 10*3/uL (ref 150–400)
RBC: 2.71 MIL/uL — AB (ref 3.87–5.11)
RDW: 15.3 % (ref 11.5–15.5)
WBC: 16.3 10*3/uL — ABNORMAL HIGH (ref 4.0–10.5)

## 2015-09-27 LAB — MAGNESIUM: MAGNESIUM: 1.9 mg/dL (ref 1.7–2.4)

## 2015-09-27 MED ORDER — LACTATED RINGERS IV BOLUS (SEPSIS)
500.0000 mL | Freq: Once | INTRAVENOUS | Status: AC
  Administered 2015-09-27: 500 mL via INTRAVENOUS

## 2015-09-27 MED ORDER — POLYSACCHARIDE IRON COMPLEX 150 MG PO CAPS
150.0000 mg | ORAL_CAPSULE | Freq: Every day | ORAL | Status: DC
  Filled 2015-09-27: qty 1

## 2015-09-27 MED ORDER — DEXTROSE 5 % IV SOLN
1.0000 g | Freq: Two times a day (BID) | INTRAVENOUS | Status: AC
  Administered 2015-09-27 – 2015-10-02 (×12): 1 g via INTRAVENOUS
  Filled 2015-09-27 (×12): qty 1

## 2015-09-27 NOTE — Progress Notes (Signed)
Patient ID: Estelle JuneShikia D Roylance, female   DOB: 10/15/1995, 20 y.o.   MRN: [id number]  Hospital Day: 3  HPI: admitted on 09/25/15 d/t shortened cervix via US. BMZ injections started. Magnesium drip started for neuroprotection per protocol. Bedrest with bathroom privlidges. +GC 09/26/15 Dr. Clearance CootsHarper notified patient.     S: No complaints. Denies chest pain or dizziness.    O: Blood pressure 109/39, pulse 110, temperature 98.3 F (36.8 C), temperature source Oral, resp. rate 18, height 5' (1.524 m), weight 129 lb (58.514 kg), last menstrual period 04/14/2015, SpO2 99 %.  Lungs: CTA.  Cardiac: Tachycardia, no murmur, gallop or rub present. ABD: gravid, normoactive.  Extremities: no edema or calf tenderness.    WUJ:[id number]:Baseline: 145 bpm, by doppler  A/P- 20 y.o. admitted with: cervical shortening and treatned preterm labor.  Currently has tachycardia of unknown origin.    Present on Admission:  . Preterm labor  Pregnancy Complications: cervical shortnening  Preterm labor management: IV D5LR started, bedrest advised and 24 hours Mag drip Dating:  8053w5d PNL Needed:  none FWB:  good PTL:  stable

## 2015-09-28 ENCOUNTER — Inpatient Hospital Stay (HOSPITAL_COMMUNITY): Payer: Medicaid Other

## 2015-09-28 LAB — IRON AND TIBC
Iron: 35 ug/dL (ref 28–170)
SATURATION RATIOS: 7 % — AB (ref 10.4–31.8)
TIBC: 504 ug/dL — ABNORMAL HIGH (ref 250–450)
UIBC: 469 ug/dL

## 2015-09-28 LAB — FERRITIN: Ferritin: 5 ng/mL — ABNORMAL LOW (ref 11–307)

## 2015-09-28 NOTE — Consult Note (Signed)
Maternal Fetal Medicine Consultation  Requesting Provider(s): Coral Ceoharles Harper, MD  Reason for consultation: shortened cervix, anemia, maternal tachycardia  HPI: Catherine Golden is a 20 yo G1P0, EDD 01/19/2016 who is currently at 23w 6d seen for consultation due to shortened cervix, anemia and maternal tachycardia.  Catherine Golden was admitted earlier this week due to shortened cervix with a cervical length of 4 mm and U-shaped funneling.  She was admitted for a betamethasone series and magnesium for neuro protection.  Her cervical cultures returned positive for GC - she was treated with Rocephin and Azithromycin and has received Ampicillin/ Azithromycin for shortened cervix. Follow up ultrasound today showed a cervical length of 4 mm (unchanged) with somewhat worsening U-shaped funneling.  She has completed a course of betamethasone.  Admission labs revealed a Hb and Hct of 8.1 and 24.6.  Lab work yesterday showed a Hb of 6.8 and Hct of 20.9.  Her baseline CBC showed a Hb of 11.1 and Hct of 32.6.  The patient had a normal Hb electrophoresis earlier this pregnancy.  Since admission, she had mild maternal tachycardia in the 100-110 range.  She is otherwise asymptomatic.  She denies shortness of breath, abdominal pain or chest pain.  OB History: OB History    Gravida Para Term Preterm AB TAB SAB Ectopic Multiple Living   1 0 0 0 0 0 0 0 0 0       PMH:  Past Medical History  Diagnosis Date  . Asthma     PSH:  Past Surgical History  Procedure Laterality Date  . Wisdom tooth extraction     Meds:  Scheduled Meds: . azithromycin  500 mg Oral Daily  . cefoTEtan (CEFOTAN) IV  1 g Intravenous Q12H  . docusate sodium  100 mg Oral BID  . iron polysaccharides  150 mg Oral Daily  . prenatal vitamin w/FE, FA  1 tablet Oral Q1200  . progesterone  200 mg Vaginal QHS  . sodium chloride flush  3 mL Intravenous Q12H   Continuous Infusions:  PRN Meds:.sodium chloride, acetaminophen, albuterol,  calcium carbonate, promethazine, sodium chloride flush, zolpidem   Allergies: No Known Allergies   FH:  Family History  Problem Relation Age of Onset  . Hypertension Father   . Diabetes Paternal Grandmother   . Hypertension Paternal Grandmother   . Cancer Neg Hx   . Hypertension Paternal Grandfather   . Cystic fibrosis Paternal Grandfather   . Diabetes Mellitus II Paternal Grandfather    Soc:  Social History   Social History  . Marital Status: Single    Spouse Name: N/A  . Number of Children: N/A  . Years of Education: N/A   Occupational History  . Not on file.   Social History Main Topics  . Smoking status: Passive Smoke Exposure - Never Smoker  . Smokeless tobacco: Never Used  . Alcohol Use: No  . Drug Use: No  . Sexual Activity:    Partners: Male    Birth Control/ Protection: None   Other Topics Concern  . Not on file   Social History Narrative     Review of Systems: no vaginal bleeding or cramping/contractions, no LOF, no nausea/vomiting. All other systems reviewed and are negative.  PNL:   PE:   Filed Vitals:   09/28/15 1154 09/28/15 1617  BP: 120/60 123/44  Pulse: 109 111  Temp: 98.4 F (36.9 C) 98.2 F (36.8 C)  Resp: 20 18    GEN: well-appearing female ABD: gravid, NT  Labs: CBC    Component Value Date/Time   WBC 16.3* 09/27/2015 0846   RBC 2.71* 09/27/2015 0846   HGB 6.8* 09/27/2015 0846   HCT 20.9* 09/27/2015 0846   PLT 326 09/27/2015 0846   MCV 77.1* 09/27/2015 0846   MCH 25.1* 09/27/2015 0846   MCHC 32.5 09/27/2015 0846   RDW 15.3 09/27/2015 0846   LYMPHSABS 2.0 09/27/2015 0846   MONOABS 1.1* 09/27/2015 0846   EOSABS 0.0 09/27/2015 0846   BASOSABS 0.0 09/27/2015 0846   CMP     Component Value Date/Time   NA 137 09/27/2015 0846   K 3.5 09/27/2015 0846   CL 106 09/27/2015 0846   CO2 24 09/27/2015 0846   GLUCOSE 101* 09/27/2015 0846   BUN <5* 09/27/2015 0846   CREATININE 0.30* 09/27/2015 0846   CALCIUM 8.8* 09/27/2015  0846   PROT 6.8 09/27/2015 0846   ALBUMIN 3.3* 09/27/2015 0846   AST 22 09/27/2015 0846   ALT 12* 09/27/2015 0846   ALKPHOS 72 09/27/2015 0846   BILITOT 0.1* 09/27/2015 0846   GFRNONAA >60 09/27/2015 0846   GFRAA >60 09/27/2015 0846     A/P: 1) Single IUP at 23w 6d  2) Shortened cervix - cervical length stable over last several days, but continues to be at very high risk for preterm delivery.  Recommend continued inpatient observation.  Concur with course of betamethasone and vaginal progesterone.  If there is any significant clinica change (i.e. Frequent uterine contractions, PROM or imminent delivery), would recommend a 12 hour course of Magnesium sulfate for neuroprotection.  Would check cervical length again next week if undelivered.  3) Positive PCR for GC - completed a course of Rocephin.  Ampicillin x 48 hrs with transition to Amoxicillin x 5 days in addition to Azithromycin is reasonable give the shortened cervix.  Would add Flagyl if the patient has BV.  Will need a test of cure at some point if undelivered.  GBS cultures are pending.  4) Anemia, mild tachycardia - feel that at least some of this is due to hydration. Would repeat CBC now not receiving IV hydration.  No evidence of acute blood loss.  This is likely iron deficiency anemia - if not already done, consider checking serum ferritin, folate and iron studies at next blood draw.  Continue iron supplementation.  If the Hb is worsened, would consider transfusion with PRBC which may improve maternal tachycardia.  If this is more consistent with a chronic iron deficiency, she may be a candidate for IV iron.  Would discuss with Hematology.   Thank you for the opportunity to be a part of the care of Catherine Golden. Please contact our office if we can be of further assistance.   I spent approximately 30 minutes with this patient with over 50% of time spent in face-to-face counseling.  Alpha Gula, MD Maternal Fetal Medicine

## 2015-09-29 LAB — CBC
HEMATOCRIT: 21.7 % — AB (ref 36.0–46.0)
HEMOGLOBIN: 7 g/dL — AB (ref 12.0–15.0)
MCH: 24.9 pg — ABNORMAL LOW (ref 26.0–34.0)
MCHC: 32.3 g/dL (ref 30.0–36.0)
MCV: 77.2 fL — AB (ref 78.0–100.0)
Platelets: 339 10*3/uL (ref 150–400)
RBC: 2.81 MIL/uL — AB (ref 3.87–5.11)
RDW: 15.5 % (ref 11.5–15.5)
WBC: 11.1 10*3/uL — AB (ref 4.0–10.5)

## 2015-09-29 NOTE — Progress Notes (Signed)
Patient ID: Catherine Golden, female   DOB: March 11, 1996, 20 y.o.   MRN: [id number]  Hospital Day: 4  HPI: admitted on 09/25/15 d/t shortened cervix via US. BMZ injections started. Magnesium drip started for neuroprotection per protocol. Bedrest with bathroom privlidges. +GC 09/26/15 Dr. Clearance CootsHarper notified patient.Course of Rocephin given.  Maternal Tachycardia noticed.  IVF bolus given.  Most likely due to IDA.     S: No complaints.  O: Blood pressure 125/67, pulse 98, temperature 98.2 F (36.8 C), temperature source Oral, resp. rate 18, height 5' (1.524 m), weight 131 lb 12.8 oz (59.784 kg), last menstrual period 04/14/2015, SpO2 100 %.   JME:QASTMHDQFHT:Baseline: 145 bpm, Accelerations: none and Decelerations: Absent Toco: None SVE:   A/P- 20 y.o. admitted with: Shortened cervix  Present on Admission:  . Preterm labor  Pregnancy Complications: Shortened cervix  Preterm labor management: stable currently, bedrest with bathroom privlidges Dating:  478w0d PNL Needed:  none FWB:  good PTL:  stable

## 2015-09-29 NOTE — Progress Notes (Signed)
Patient ID: Catherine Golden, female   DOB: 04/29/1996, 20 y.o.   MRN: [id number]  Hospital Day: 5   HPI: admitted on 09/25/15 d/t shortened cervix via US. BMZ injections started. Magnesium drip started for neuroprotection per protocol. Bedrest with bathroom privlidges. +GC 09/26/15 Dr. Clearance CootsHarper notified patient.Course of Rocephin given. Maternal Tachycardia noticed. IVF bolus given. Most likely due to IDA. MFM consult completed on 09/28/15, see note.    S: No Complaints  O: Blood pressure 125/67, pulse 98, temperature 98.2 F (36.8 C), temperature source Oral, resp. rate 18, height 5' (1.524 m), weight 131 lb 12.8 oz (59.784 kg), last menstrual period 04/14/2015, SpO2 100 %.   WUJ:WJXBJYNWFHT:Baseline: 145 bpm, Accelerations: none and Decelerations: Absent Toco: None SVE:   A/P- 20 y.o. admitted with: Shortened cervix, threatened preterm labor, maternal tachycardia   Present on Admission:  . Preterm labor  Pregnancy Complications: shortened cervix with funneling  Preterm labor management: bedrest advised and bathroom privlidges Dating:  6619w0d PNL Needed:  none FWB:  good PTL:  Stable currently

## 2015-09-30 NOTE — Progress Notes (Signed)
Patient ID: Catherine Golden, female   DOB: May 26, 1996, 20 y.o.   MRN: [id number] Vital signs normal There has been no change in her condition since yesterday

## 2015-10-01 NOTE — Progress Notes (Signed)
Patient ID: Catherine JuneShikia D Golden, female   DOB: [birth year], 20 y.o.   MRN: [id number] Vital signs normal Patient Catherine Golden because of a shot cervix status is unchanged

## 2015-10-02 ENCOUNTER — Ambulatory Visit (HOSPITAL_COMMUNITY): Payer: Medicaid Other

## 2015-10-02 ENCOUNTER — Encounter (HOSPITAL_COMMUNITY): Payer: Self-pay | Admitting: *Deleted

## 2015-10-02 NOTE — Progress Notes (Signed)
Patient ID: Estelle JuneShikia D Pollard, female   DOB: Aug 11, 1995, 20 y.o.   MRN: [id number] Hospital Day: 8  S: Preterm labor symptoms: Preterm cervical changes.  O: Blood pressure 103/60, pulse 100, temperature 97.8 F (36.6 C), temperature source Oral, resp. rate 16, height 5' (1.524 m), weight 131 lb 12.8 oz (59.784 kg), last menstrual period 04/14/2015, SpO2 100 %.   XBM:WUXLKGMWFHT:Baseline: 140 bpm Toco: None SVE:   A/P- 20 y.o. admitted with:  Preterm cervical changes on ultrasound.  Stable on bedrest.  Continue bedrest.  Present on Admission:  . Preterm labor  Pregnancy Complications: Baseline: 140 bpm  Preterm labor management: modified bedrest advised and preterm labor education provided Dating:  7223w3d PNL Needed:  none FWB:  good PTL:  stable

## 2015-10-02 NOTE — Progress Notes (Signed)
Initial Nutrition Assessment  DOCUMENTATION CODES:   Not applicable  INTERVENTION:  Regular diet May order double protein portions, snacks TID and from retail  NUTRITION DIAGNOSIS:   Increased nutrient needs related to  (pregnancy and fetal growth requirements) as evidenced by  (24 weeks IUP).  GOAL:   Patient will meet greater than or equal to 90% of their needs  MONITOR:   Weight trends  REASON FOR ASSESSMENT:   Antenatal   ASSESSMENT:   24 3/7 weeks, PTL. Pre-preg weight 118 Lbs, 13 lb weight gain. Pre-preg BMI 23.1 Appetite good, diet tol well On PNV, Iron supplement for 4/7 Hct of 21.7 %. Hx of vitamin D deficiency  Diet Order:  Diet regular Room service appropriate?: Yes; Fluid consistency:: Thin  Skin:  Reviewed, no issues  Height:   Ht Readings from Last 1 Encounters:  09/25/15 5' (1.524 m) (5 %*, Z = -1.68)   * Growth percentiles are based on CDC 2-20 Years data.   Weight:   Wt Readings from Last 1 Encounters:  09/27/15 131 lb 12.8 oz (59.784 kg) (58 %*, Z = 0.21)   * Growth percentiles are based on CDC 2-20 Years data.    Ideal Body Weight:  45.5 kg  BMI:  Body mass index is 25.74 kg/(m^2).  Estimated Nutritional Needs:   Kcal:  1700-1900  Protein:  75-85 g  Fluid:  2 L  EDUCATION NEEDS:   No education needs identified at this time  Inez PilgrimKatherine Bettejane Leavens M.Odis LusterEd. R.D. LDN Neonatal Nutrition Support Specialist/RD III Pager [phone]      Phone [phone]

## 2015-10-02 NOTE — Progress Notes (Signed)
Patient's boyfriend was asked and reminded to stay out of patients' bed while she sleeps. He was given a full set of bed linen for his own bed and at times were seen sleeping in his own bed. Boyfriend verbalized understanding of same.

## 2015-10-03 DIAGNOSIS — D509 Iron deficiency anemia, unspecified: Secondary | ICD-10-CM | POA: Insufficient documentation

## 2015-10-03 LAB — OCCULT BLOOD X 1 CARD TO LAB, STOOL: Fecal Occult Bld: NEGATIVE

## 2015-10-03 NOTE — Consult Note (Signed)
[facility]  [phone]   HEMATOLOGY ONCOLOGY INPATIENT CONSULTATION   DAVANNA Golden  DOB: 12/03/1995  MR#: [id number]  CSN#: [id number]    Requesting Physician: Dr. Coral Ceo at [facility]   Reason for consult: Worsening anemia  History of present illness:   [name] is a 20 year old female without significant past medical history, with 24w pregnancy, was recently admitted to almost hospital for preterm cervical change. She was noticed to have worsening anemia, and hematology consult was called.  This is a her first pregnancy. She denies past medical history of anemia. She did have heavy period, especially on the 2 and 3 she uses extra large tampon, your hemoglobin was around 11 in 3 months ago. She has been on oral partners such a right one tablet daily and multivitamins. She was admitted 8 days ago, hemoglobin was 8.1, was hematocrit 24.6, MCV 76.6, and her her CBC 4-6 days ago showed hemoglobin 6.8-7.0. She denies any signs of bleeding, no hematochezia or melena. She feels well overall, denies any dyspnea or chest discomfort with exertion. She is at bedrest, able to use bathroom on her own.   MEDICAL HISTORY:  Past Medical History  Diagnosis Date  . Asthma     SURGICAL HISTORY: Past Surgical History  Procedure Laterality Date  . Wisdom tooth extraction      SOCIAL HISTORY: Social History   Social History  . Marital Status: Single    Spouse Name: N/A  . Number of Children: N/A  . Years of Education: N/A   Occupational History  . Not on file.   Social History Main Topics  . Smoking status: Passive Smoke Exposure - Never Smoker  . Smokeless tobacco: Never Used  . Alcohol Use: No  . Drug Use: No  . Sexual Activity:    Partners: Male    Birth Control/ Protection: None   Other Topics Concern  . Not on file   Social History Narrative    FAMILY HISTORY: Family History  Problem Relation Age of Onset  .  Hypertension Father   . Diabetes Paternal Grandmother   . Hypertension Paternal Grandmother   . Cancer Neg Hx   . Hypertension Paternal Grandfather   . Cystic fibrosis Paternal Grandfather   . Diabetes Mellitus II Paternal Grandfather     ALLERGIES:  has No Known Allergies.  MEDICATIONS:  Current Facility-Administered Medications  Medication Dose Route Frequency Provider Last Rate Last Dose  . 0.9 %  sodium chloride infusion  250 mL Intravenous PRN Brock Bad, MD      . acetaminophen (TYLENOL) tablet 650 mg  650 mg Oral Q4H PRN Brock Bad, MD      . albuterol (PROVENTIL) (2.5 MG/3ML) 0.083% nebulizer solution 3 mL  3 mL Inhalation Q4H PRN Brock Bad, MD      . calcium carbonate (TUMS - dosed in mg elemental calcium) chewable tablet 400 mg of elemental calcium  2 tablet Oral Q4H PRN Brock Bad, MD   400 mg of elemental calcium at 09/30/15 2205  . docusate sodium (COLACE) capsule 100 mg  100 mg Oral BID Brock Bad, MD   100 mg at 10/03/15 1051  . iron polysaccharides (NIFEREX) capsule 150 mg  150 mg Oral Daily Brock Bad, MD   150 mg at 10/03/15 1051  . prenatal vitamin w/FE, FA (NATACHEW) chewable tablet 1 tablet  1 tablet Oral Q1200 Brock Bad, MD   1 tablet at  10/03/15 1050  . progesterone (PROMETRIUM) capsule 200 mg  200 mg Vaginal QHS Brock Bad, MD   200 mg at 10/02/15 2201  . promethazine (PHENERGAN) tablet 25 mg  25 mg Oral Q6H PRN Brock Bad, MD   25 mg at 09/25/15 1828  . sodium chloride flush (NS) 0.9 % injection 3 mL  3 mL Intravenous Q12H Brock Bad, MD   3 mL at 10/03/15 1050  . sodium chloride flush (NS) 0.9 % injection 3 mL  3 mL Intravenous PRN Brock Bad, MD      . zolpidem Remus Loffler) tablet 5 mg  5 mg Oral QHS PRN Brock Bad, MD   5 mg at 09/26/15 2130    REVIEW OF SYSTEMS:   Constitutional: Denies fevers, chills or abnormal night sweats, mild fatigue  Eyes: Denies blurriness of vision, double vision  or watery eyes Ears, nose, mouth, throat, and face: Denies mucositis or sore throat Respiratory: Denies cough, dyspnea or wheezes Cardiovascular: Denies palpitation, chest discomfort or lower extremity swelling Gastrointestinal:  Denies nausea, heartburn or change in bowel habits Skin: Denies abnormal skin rashes Lymphatics: Denies new lymphadenopathy or easy bruising Neurological:Denies numbness, tingling or new weaknesses Behavioral/Psych: Mood is stable, no new changes  All other systems were reviewed with the patient and are negative.  PHYSICAL EXAMINATION: ECOG PERFORMANCE STATUS: 1 - Symptomatic but completely ambulatory  Filed Vitals:   10/03/15 1009 10/03/15 1532  BP: 106/57 124/60  Pulse: 103 117  Temp: 98.4 F (36.9 C) 98.5 F (36.9 C)  Resp: 18 16   Filed Weights   09/25/15 1142 09/27/15 0910  Weight: 129 lb (58.514 kg) 131 lb 12.8 oz (59.784 kg)    GENERAL:alert, no distress and comfortable SKIN: skin color, texture, turgor are normal, no rashes or significant lesions EYES: normal, conjunctiva are pink and non-injected, sclera clear OROPHARYNX:no exudate, no erythema and lips, buccal mucosa, and tongue normal  NECK: supple, thyroid normal size, non-tender, without nodularity LYMPH:  no palpable lymphadenopathy in the cervical, axillary or inguinal LUNGS: clear to auscultation and percussion with normal breathing effort HEART: regular rate & rhythm and no murmurs and no lower extremity edema ABDOMEN:abdomen soft, non-tender and normal bowel sounds, (+) distended due to her pregnancy  Musculoskeletal:no cyanosis of digits and no clubbing  PSYCH: alert & oriented x 3 with fluent speech NEURO: no focal motor/sensory deficits  LABORATORY DATA:  I have reviewed the data as listed Lab Results  Component Value Date   WBC 11.1* 09/29/2015   HGB 7.0* 09/29/2015   HCT 21.7* 09/29/2015   MCV 77.2* 09/29/2015   PLT 339 09/29/2015    Recent Labs  09/27/15 0846  NA  137  K 3.5  CL 106  CO2 24  GLUCOSE 101*  BUN <5*  CREATININE 0.30*  CALCIUM 8.8*  GFRNONAA >60  GFRAA >60  PROT 6.8  ALBUMIN 3.3*  AST 22  ALT 12*  ALKPHOS 72  BILITOT 0.1*    RADIOGRAPHIC STUDIES: I have personally reviewed the radiological images as listed and agreed with the findings in the report. [name] Mfm Ob Transvaginal  09/28/2015  OBSTETRICAL ULTRASOUND: This exam was performed within a [place] [facility]. The OB US report was generated in the AS system, and faxed to the ordering physician.  This report is available in the [employer]. See the AS Obstetric US report via the Image Link.  [name] Mfm Ob Transvaginal  09/25/2015  OBSTETRICAL ULTRASOUND: This exam [facility] performed  [facility] a [facility]. The OB US report was generated in the AS system, and faxed to the ordering physician.  This report is available in the [employer]. See the AS Obstetric US report via the Image Link.  [place] Mfm Ob Comp + 14 Wk  09/25/2015  OBSTETRICAL ULTRASOUND: This exam was performed within a [place] Ultrasound Department. The OB US report was generated in the AS system, and faxed to the ordering physician.  This report is available in the [employer] P[employer]. See the AS Obstetric US report via the Image Link.   ASSESSMENT & PLAN: 20 yo female w/o significant past medical history, currently 24 weeks pregnancy, presented with worsening anemia   1. Iron deficienct anemia  -She has microcytic anemia, iron studies showed ferritin 5, low transferrin saturation 7%, significant elevated TIBC, this is all consistent with iron deficient anemia, likely secondary to her pregnancy. She did have menorrhagia before pregnancy. -she has no clinical signs of bleeding, would consider stool OB to ruled out GI bleeding -We'll obtain more lab tests to rule out hemolysis, she previously had hemoglobin electrophoresis on January 2017, which was negative. No family history of sickle cell disease or  thalassemia -I recommend IV Feraheme, 510 mg over 30 minutes to 1hr, and repeat in one week. Potential benefit and side effects, especially allergy reaction and anaphylactic reaction were discussed with her, she agrees. -I encouraged her to increase oral iron to 2-3 tablets a day if she can tolerate -Her anemia will likely improve within a few weeks after IV Feraheme, I think we can hold on blood transfusion at this point. Certainly if her anemia is felt to compromise her fetal, then blood transfusion would be reasonable. Her mother asked the question about if she can get blood transfusion from her father if needed.   Recommendations: -lab reticular count, LDH, haptoglobin, I'll take the liberty to order it for tomorrow morning  -stool OB  -Please consider IV Feraheme 510 mg in the next few days and repeat in one week  -Monitor CBC weekly   -I will follow up in a few days after her lab test, and every 2-4 weeks afterward, either here or in my office.   All questions were answered. The patient knows to call the clinic with any problems, questions or concerns.      Malachy MoodFeng, Samnang Shugars, MD 10/03/2015 5:29 PM

## 2015-10-03 NOTE — Progress Notes (Signed)
Patient ID: Catherine Golden, female   DOB: [birth year], 20 y.o.   MRN: [id number] Hospital Day: 9  HPI: admitted on 09/25/15 d/t shortened cervix via US. BMZ injections started. Magnesium drip started for neuroprotection per protocol. Bedrest with bathroom privlidges. +GC 09/26/15 Dr. Clearance CootsHarper notified patient.Course of Rocephin given. Maternal Tachycardia noticed. IVF bolus given. Most likely due to IDA. MFM consult completed on 09/28/15, see note.    S: Preterm labor symptoms: Preterm cervical changes.  No complaints this AM.    O: Blood pressure 102/44, pulse 95, temperature 97.9 F (36.6 C), temperature source Oral, resp. rate 18, height 5' (1.524 m), weight 131 lb 12.8 oz (59.784 kg), last menstrual period 04/14/2015, SpO2 98 %.   KVQ:QVZDGLOVFHT:Baseline: 150 bpm, Variability: Fair (1-6 bpm), Accelerations: Non-reactive but appropriate for gestational age and Decelerations: Absent Toco: None SVE:   A/P- 20 y.o. admitted with: Preterm cervical changes on ultrasound. Stable on bedrest. Continue bedrest.  Present on Admission:  . Preterm labor  Pregnancy Complications: Cervical shortnening  Preterm labor management:modified bedrest advised and preterm labor education provided Dating:  1259w4d PNL Needed:  none FWB:  Good  PTL:  Stable

## 2015-10-04 ENCOUNTER — Other Ambulatory Visit: Payer: Self-pay | Admitting: *Deleted

## 2015-10-04 DIAGNOSIS — O26872 Cervical shortening, second trimester: Secondary | ICD-10-CM

## 2015-10-04 LAB — CBC
HEMATOCRIT: 22.3 % — AB (ref 36.0–46.0)
HEMOGLOBIN: 7.1 g/dL — AB (ref 12.0–15.0)
MCH: 24.2 pg — ABNORMAL LOW (ref 26.0–34.0)
MCHC: 31.8 g/dL (ref 30.0–36.0)
MCV: 76.1 fL — ABNORMAL LOW (ref 78.0–100.0)
PLATELETS: 307 10*3/uL (ref 150–400)
RBC: 2.93 MIL/uL — AB (ref 3.87–5.11)
RDW: 16 % — ABNORMAL HIGH (ref 11.5–15.5)
WBC: 10.3 10*3/uL (ref 4.0–10.5)

## 2015-10-04 LAB — RETICULOCYTES
RBC.: 2.93 MIL/uL — AB (ref 3.87–5.11)
RETIC COUNT ABSOLUTE: 105.5 10*3/uL (ref 19.0–186.0)
RETIC CT PCT: 3.6 % — AB (ref 0.4–3.1)

## 2015-10-04 LAB — LACTATE DEHYDROGENASE: LDH: 152 U/L (ref 98–192)

## 2015-10-04 MED ORDER — FERROUS SULFATE 325 (65 FE) MG PO TABS
325.0000 mg | ORAL_TABLET | Freq: Three times a day (TID) | ORAL | Status: DC
  Administered 2015-10-05 – 2015-10-14 (×26): 325 mg via ORAL
  Filled 2015-10-04 (×28): qty 1

## 2015-10-04 MED ORDER — SODIUM CHLORIDE 0.9 % IV SOLN
510.0000 mg | INTRAVENOUS | Status: AC
  Administered 2015-10-05 – 2015-10-12 (×2): 510 mg via INTRAVENOUS
  Filled 2015-10-04 (×2): qty 17

## 2015-10-04 NOTE — Progress Notes (Signed)
Patient ID: Catherine Golden, female   DOB: [birth year], 20 y.o.   MRN: [id number]  Hospital Day: 10  HPI: admitted on 09/25/15 d/t shortened cervix via US. BMZ injections started. Magnesium drip started for neuroprotection per protocol. Bedrest with bathroom privlidges. +GC 09/26/15 Dr. Clearance CootsHarper notified patient.Course of Rocephin given. Maternal Tachycardia noticed. IVF bolus given. Most likely due to IDA. MFM consult completed on 09/28/15, see note. Cervical length 0.794mm on 09/28/15. Hematology consult on 10/03/15 for anemia.  IVPB Feraheme ordered as suggested for infusion starting 10/05/15 and repeat in one week.    S: Preterm labor symptoms: cervical shortening.  No complaints  O: Blood pressure 119/62, pulse 107, temperature 98.7 F (37.1 C), temperature source Oral, resp. rate 18, height 5' (1.524 m), weight 129 lb 3.2 oz (58.605 kg), last menstrual period 04/14/2015, SpO2 98 %.   WUJ:WJXBJYNWFHT:Baseline: 145 bpm, Variability: Good {> 6 bpm), Accelerations: Reactive and Decelerations: Variable: mild Toco: None SVE:   A/P- 20 y.o. admitted with: Probable preterm labor and cervical shortening; Chronic Anemia  Present on Admission:  . Preterm labor  Pregnancy Complications: anemia, cervical shortening  Preterm labor management: stable with bedrest Dating:  7947w5d PNL Needed:  None at this time FWB:  good PTL:  Stable, consider cervical length repeat around 10/12/15

## 2015-10-05 LAB — OCCULT BLOOD X 1 CARD TO LAB, STOOL: FECAL OCCULT BLD: NEGATIVE

## 2015-10-05 LAB — HAPTOGLOBIN: Haptoglobin: 127 mg/dL (ref 34–200)

## 2015-10-05 LAB — TYPE AND SCREEN
ABO/RH(D): B POS
Antibody Screen: NEGATIVE

## 2015-10-05 MED ORDER — SODIUM CHLORIDE 0.9 % IV SOLN
INTRAVENOUS | Status: DC
  Administered 2015-10-05 – 2015-10-12 (×2): via INTRAVENOUS

## 2015-10-05 MED ORDER — PRAMOXINE-ZINC OXIDE IN MO 1-12.5 % RE OINT: TOPICAL_OINTMENT | Freq: Three times a day (TID) | RECTAL | Status: DC | PRN

## 2015-10-05 MED ORDER — HYDROCORTISONE ACETATE 25 MG RE SUPP
25.0000 mg | Freq: Two times a day (BID) | RECTAL | Status: DC
  Administered 2015-10-05 – 2015-10-24 (×26): 25 mg via RECTAL
  Filled 2015-10-05 (×49): qty 1

## 2015-10-05 MED ORDER — HYDROCORTISONE 1 % EX CREA
TOPICAL_CREAM | Freq: Four times a day (QID) | CUTANEOUS | Status: DC
  Administered 2015-10-05 – 2015-10-25 (×29): via TOPICAL
  Filled 2015-10-05: qty 28

## 2015-10-05 NOTE — Progress Notes (Signed)
Patient ID: Catherine Golden, female   DOB: 08/03/1995, 20 y.o.   MRN: [id number]  Hospital Day: 11  HPI: admitted on 09/25/15 d/t shortened cervix via US. BMZ injections started. Magnesium drip started for neuroprotection per protocol. Bedrest with bathroom privileges. +GC 09/26/15 Dr. Clearance CootsHarper notified patient.Course of Rocephin given. Maternal Tachycardia noticed. IVF bolus given. Most likely due to IDA. MFM consult completed on 09/28/15, see note. Cervical length 0.484mm on 09/28/15. Hematology consult on 10/03/15 for anemia. IVPB Feraheme ordered as suggested for infusion starting 10/05/15 and repeat in one week.   S: Preterm labor symptoms: cervical shortening.  C/O hemorrhoidal itching, pain with sitting, denies any bleeding.  States bowel movements are loose.    O: Blood pressure 113/65, pulse 105, temperature 98.2 F (36.8 C), temperature source Oral, resp. rate 20, height 5' (1.524 m), weight 129 lb 3.2 oz (58.605 kg), last menstrual period 04/14/2015, SpO2 98 %.   FAO:ZHYQMVHQFHT:Baseline: 145 bpm, Variability: Good {> 6 bpm), Accelerations: Reactive and Decelerations: Variable: mild Toco: None SVE:   A/P- 20 y.o. admitted with: threatened preterm labor and cervical shortening, chronic anemia Iron infusion currently infusing. hemorrhoids  Present on Admission:  . Preterm labor  Pregnancy Complications: cervical shortening  Preterm labor management: bedrest advised and pelvic rest advised Dating:  2936w6d PNL Needed:  none FWB:  good PTL:  stable

## 2015-10-05 NOTE — Progress Notes (Signed)
Catherine Golden   DOB:Oct 29, 1995   XB#:[id number]   NUU#:[id number]  Service: hematology   Subjective: Pt received first dose iv feraheme this morning, tolerated very well without any fractions. She feels slightly better after infusion. I discussed her lab results with her.   Objective:  Filed Vitals:   10/05/15 1550 10/05/15 2000  BP: 119/72 117/60  Pulse: 101 98  Temp: 98.2 F (36.8 C) 98.6 F (37 C)  Resp: 20 16    Body mass index is 25.23 kg/(m^2).  Intake/Output Summary (Last 24 hours) at 10/05/15 2140 Last data filed at 10/05/15 3474  Gross per 24 hour  Intake      3 ml  Output      0 ml  Net      3 ml     Sclerae unicteric  Oropharynx clear  No peripheral adenopathy  Lungs clear -- no rales or rhonchi  Heart regular rate and rhythm  Abdomen benign   MSK no focal spinal tenderness, no peripheral edema  Neuro nonfocal   CBG (last 3)  No results for input(s): GLUCAP in the last 72 hours.   Labs:  Lab Results  Component Value Date   WBC 10.3 10/04/2015   HGB 7.1* 10/04/2015   HCT 22.3* 10/04/2015   MCV 76.1* 10/04/2015   PLT 307 10/04/2015   NEUTROABS 13.2* 09/27/2015    @  Urine Studies No results for input(s): UHGB, CRYS in the last 72 hours.  Invalid input(s): UACOL, UAPR, USPG, UPH, UTP, UGL, UKET, UBIL, UNIT, UROB, ULEU, UEPI, UWBC, URBC, UBAC, CAST, UCOM, BILUA  Basic Metabolic Panel: No results for input(s): NA, K, CL, CO2, GLUCOSE, BUN, CREATININE, CALCIUM, MG, PHOS in the last 168 hours. GFR Estimated Creatinine Clearance: 90.5 mL/min (by C-G formula based on Cr of 0.3). Liver Function Tests: No results for input(s): AST, ALT, ALKPHOS, BILITOT, PROT, ALBUMIN in the last 168 hours. No results for input(s): LIPASE, AMYLASE in the last 168 hours. No results for input(s): AMMONIA in the last 168 hours. Coagulation profile No results for input(s): INR, PROTIME in the last 168 hours.  CBC:  Recent Labs Lab 09/29/15 0633  10/04/15 0529  WBC 11.1* 10.3  HGB 7.0* 7.1*  HCT 21.7* 22.3*  MCV 77.2* 76.1*  PLT 339 307   Cardiac Enzymes: No results for input(s): CKTOTAL, CKMB, CKMBINDEX, TROPONINI in the last 168 hours. BNP: Invalid input(s): POCBNP CBG: No results for input(s): GLUCAP in the last 168 hours. D-Dimer No results for input(s): DDIMER in the last 72 hours. Hgb A1c No results for input(s): HGBA1C in the last 72 hours. Lipid Profile No results for input(s): CHOL, HDL, LDLCALC, TRIG, CHOLHDL, LDLDIRECT in the last 72 hours. Thyroid function studies No results for input(s): TSH, T4TOTAL, T3FREE, THYROIDAB in the last 72 hours.  Invalid input(s): FREET3 Anemia work up  [employer]  10/04/15 0529  RETICCTPCT 3.6*   Microbiology No results found for this or any previous visit (from the past 240 hour(s)).    Studies:  No results found.  Assessment: 20 y.o.  female w/o significant past medical history, currently 24 weeks pregnancy, presented with worsening anemia   1. Iron deficient anemia  Plan:  -Her hemolysis lab (LDH and haptoglobin) were normal, no evidence of hemolysis. Her reticular count has slightly increased, likely secondary to her recent oral iron supplement -She has received first dose of IV Feraheme, tolerated well. Please give one more dose in one week. -Follow-up her CBC+reticulocytes weekly when she  is in the hospital, repeat ferritin and serum iron+TIBC in 2-3 weeks, if still low, consider 1-2 more dose of feraheme  -I anticipate her anemia will significant improve within 2-4 weeks, if not, please let me know.  -I will see her again in 4 weeks if she goes home   Please call me if you have any questions.   Malachy MoodFeng, Bellami Farrelly, MD 10/05/2015  9:40 PM  Office number: [phone]

## 2015-10-06 LAB — OCCULT BLOOD X 1 CARD TO LAB, STOOL: FECAL OCCULT BLD: NEGATIVE

## 2015-10-06 NOTE — Progress Notes (Signed)
Hand held US transducer x 1.5 min.

## 2015-10-06 NOTE — Progress Notes (Signed)
Patient ID: Catherine Golden, female   DOB: 1996-03-25, 20 y.o.   MRN: [id number] Hospital Day: 8712  S: No complaints.  O: Blood pressure 106/43, pulse 98, temperature 98.1 F (36.7 C), temperature source Oral, resp. rate 18, height 5' (1.524 m), weight 129 lb 3.2 oz (58.605 kg), last menstrual period 04/14/2015, SpO2 98 %.   WUJ:WJXBJYNWFHT:Baseline: 140 bpm Toco: None SVE:   A/P- 20 y.o. admitted with:  Ultrasound revealing a 0.4cm cervical length.  No PTL.  Also is anemic with chronic iron deficiency.  Stable.  Received IV Feraheme and is feeling good.  Continue bedrest.  Present on Admission:  . Preterm labor  Pregnancy Complications: Preterm cervical changes.  Preterm labor management: no treatment necessary Dating:  5943w0d PNL Needed:  none FWB:  good PTL:  none

## 2015-10-07 NOTE — Progress Notes (Signed)
Patient ID: Catherine Golden, female   DOB: Nov 25, 1995, 20 y.o.   MRN: [id number] Hospital Day: 6513  S: No complaints.  O: Blood pressure 107/57, pulse 109, temperature 98 F (36.7 C), temperature source Oral, resp. rate 16, height 5' (1.524 m), weight 129 lb 3.2 oz (58.605 kg), last menstrual period 04/14/2015, SpO2 98 %.   WUJ:WJXBJYNWFHT:Baseline: 140 bpm Toco: None SVE:   A/P- 20 y.o. admitted with: Preterm cervical changes on U/S.  Stable on bedrest.  Continue bedrest.  Present on Admission:  . Preterm labor  Pregnancy Complications: Preterm cervical changes  Preterm labor management: no treatment necessary Dating:  5984w1d PNL Needed:  none FWB:  good PTL:  stable

## 2015-10-08 LAB — TYPE AND SCREEN
ABO/RH(D): B POS
Antibody Screen: NEGATIVE

## 2015-10-08 NOTE — Progress Notes (Signed)
Patient ID: Catherine JuneShikia D Golden, female   DOB: Sep 05, 1995, 20 y.o.   MRN: [id number] Hospital Day: 6114  S: No complaints.  O: Blood pressure 115/47, pulse 99, temperature 98.5 F (36.9 C), temperature source Oral, resp. rate 18, height 5' (1.524 m), weight 129 lb 3.2 oz (58.605 kg), last menstrual period 04/14/2015, SpO2 98 %.   JYN:WGNFAOZHFHT:Baseline: 150 bpm Toco: None SVE:   A/P- 20 y.o. admitted with: Preterm cervical effacement on ultrasound.  Stable on bedrest.  Continue bedrest.  Present on Admission:  . Preterm labor  Pregnancy Complications: Preterm cervical changes  Preterm labor management: no treatment necessary Dating:  8712w2d PNL Needed:  none FWB:  good PTL:  stable

## 2015-10-09 NOTE — Care Management Note (Deleted)
Case Management Note  Patient Details  Name: Catherine Golden MRN: [id number] Date of Birth: Mar 19, 1996                             Expected Discharge Plan:  [place]   Discharge planning Services  CM Consult   DME Arranged:  Bili blanket DME Agency:  [employer]  :     Status of Service:  Completed, signed off    Additional Comments:  CM received call from [place] from [employer] stating that patient needs single photo therapy.  CM met with patient's mom in the room and referred her to [facility] # [phone]  And demographics reviewed with patient's mom and all information called verbally faxed also to [employer] # [phone].  Lights will be delivered within 2 hours approximately to the hospital. Phone Number of [employer] and CM phone number given to patient.    Yong Channel, RN 10/09/2015, 1:55 PM

## 2015-10-10 ENCOUNTER — Other Ambulatory Visit: Payer: Self-pay | Admitting: Certified Nurse Midwife

## 2015-10-10 NOTE — Progress Notes (Signed)
Patient ID: Catherine Golden, female   DOB: 18-May-1996, 20 y.o.   MRN: [id number]  Hospital Day: 16  HPI: admitted on 09/25/15 d/t shortened cervix via US. BMZ injections started. Magnesium drip started for neuroprotection per protocol. Bedrest with bathroom privileges. +GC 09/26/15 Dr. Clearance CootsHarper notified patient.Course of Rocephin given. Maternal Tachycardia noticed. IVF bolus given. Most likely due to IDA. MFM consult completed on 09/28/15, see note. Cervical length 0.334mm on 09/28/15. Hematology consult on 10/03/15 for anemia. IVPB Feraheme ordered as suggested for infusion starting 10/05/15 and repeat in one week.  S: Preterm labor symptoms: no complaints  O: Blood pressure 113/61, pulse 98, temperature 98 F (36.7 C), temperature source Oral, resp. rate 18, height 5' (1.524 m), weight 129 lb 3.2 oz (58.605 kg), last menstrual period 04/14/2015, SpO2 98 %.   WUJ:WJXBJYNWFHT:Baseline: 140 bpm, Variability: Good {> 6 bpm), Accelerations: Reactive and Decelerations: Variable: mild Toco: None SVE:   A/P- 20 y.o. admitted with: dynamic cervical changes and threatened preterm labor  Present on Admission:  . Preterm labor  Pregnancy Complications: cervical shortening, 0.24mm cervical length with funneling  Preterm labor management: bedrest advised and pelvic rest advised Dating:  7036w4d PNL Needed:  none FWB:  Good  PTL:  Stable

## 2015-10-11 LAB — CBC WITH DIFFERENTIAL/PLATELET
BASOS PCT: 0 %
Basophils Absolute: 0 10*3/uL (ref 0.0–0.1)
Eosinophils Absolute: 0.1 10*3/uL (ref 0.0–0.7)
Eosinophils Relative: 1 %
HCT: 25.8 % — ABNORMAL LOW (ref 36.0–46.0)
Hemoglobin: 8.2 g/dL — ABNORMAL LOW (ref 12.0–15.0)
LYMPHS ABS: 1.6 10*3/uL (ref 0.7–4.0)
Lymphocytes Relative: 19 %
MCH: 25.2 pg — AB (ref 26.0–34.0)
MCHC: 31.8 g/dL (ref 30.0–36.0)
MCV: 79.1 fL (ref 78.0–100.0)
MONO ABS: 0.4 10*3/uL (ref 0.1–1.0)
Monocytes Relative: 5 %
NEUTROS ABS: 6.3 10*3/uL (ref 1.7–7.7)
NEUTROS PCT: 75 %
OTHER: 0 %
PLATELETS: 266 10*3/uL (ref 150–400)
RBC: 3.26 MIL/uL — ABNORMAL LOW (ref 3.87–5.11)
RDW: 21.4 % — AB (ref 11.5–15.5)
WBC: 8.4 10*3/uL (ref 4.0–10.5)

## 2015-10-11 LAB — TYPE AND SCREEN
ABO/RH(D): B POS
ANTIBODY SCREEN: NEGATIVE

## 2015-10-11 MED ORDER — PROMETHAZINE HCL 25 MG PO TABS
25.0000 mg | ORAL_TABLET | Freq: Four times a day (QID) | ORAL | Status: DC | PRN
Start: 2015-10-11 — End: 2015-10-11

## 2015-10-11 MED ORDER — ONDANSETRON HCL 4 MG/2ML IJ SOLN: 4.0000 mg | Freq: Four times a day (QID) | INTRAMUSCULAR | Status: DC | PRN

## 2015-10-11 NOTE — Progress Notes (Addendum)
U/S transducer hand held x4 minutes.  15x15 accel noted.  Fetal movement audible & observed.

## 2015-10-11 NOTE — Progress Notes (Signed)
Patient ID: Catherine Golden, female   DOB: 1995/09/15, 20 y.o.   MRN: [id number]  Hospital Day: 17  HPI: admitted on 09/25/15 d/t shortened cervix via US. BMZ injections started. Magnesium drip started for neuroprotection per protocol. Bedrest with bathroom privileges. +GC 09/26/15 Dr. Clearance CootsHarper notified patient.Course of Rocephin given. Maternal Tachycardia noticed. IVF bolus given. Most likely due to IDA. MFM consult completed on 09/28/15, see note. Cervical length 0.524mm on 09/28/15. Hematology consult on 10/03/15 for anemia. IVPB Feraheme ordered as suggested for infusion starting 10/05/15 and repeat in one week.  S: Preterm labor symptoms: none.  States that she has nausea with iron pills.  Encouraged her to use antiemetics and eat before taking the iron.    O: Blood pressure 109/56, pulse 95, temperature 98.6 F (37 C), temperature source Oral, resp. rate 16, height 5' (1.524 m), weight 129 lb 3.2 oz (58.605 kg), last menstrual period 04/14/2015, SpO2 98 %.   BJY:NWGNFAOZFHT:Baseline: 135 bpm, Variability: Good {> 6 bpm), Accelerations: Reactive and Decelerations: Variable: mild Toco: None SVE:   A/P- 20 y.o. admitted with:  Present on Admission:  . Preterm labor  Pregnancy Complications: Cervical length shortening, dynamic cervical changes\  Preterm labor management: bedrest advised and pelvic rest advised Dating:  6946w5d PNL Needed:  F/U CBC ordered FWB:  good3 PTL:  Stable  P: F/U US 10/12/15 for cervical length, 2nd dose of IVP Feraheme 4/20.

## 2015-10-12 ENCOUNTER — Inpatient Hospital Stay (HOSPITAL_COMMUNITY): Payer: Medicaid Other

## 2015-10-12 NOTE — Progress Notes (Signed)
Patient ID: Estelle JuneShikia D Seney, female   DOB: 1996-03-21, 20 y.o.   MRN: [id number]  Hospital Day: 18  HPI: admitted on 09/25/15 d/t shortened cervix via US. BMZ injections started. Magnesium drip started for neuroprotection per protocol. Bedrest with bathroom privileges. +GC 09/26/15 Dr. Clearance CootsHarper notified patient.Course of Rocephin given. Maternal Tachycardia noticed. IVF bolus given. Most likely due to IDA. MFM consult completed on 09/28/15, see note. Cervical length 0.604mm on 09/28/15. Hematology consult on 10/03/15 for anemia. IVPB Feraheme ordered as suggested for infusion starting 10/05/15 and repeat in one week.   S: Preterm labor symptoms: none.  No Complaints.  +FM.    O: Blood pressure 105/57, pulse 93, temperature 98.1 F (36.7 C), temperature source Oral, resp. rate 16, height 5' (1.524 m), weight 128 lb 14.4 oz (58.469 kg), last menstrual period 04/14/2015, SpO2 98 %.   NWG:NFAOZHYQMVHFHT:Variability: Good {> 6 bpm) and Accelerations: Reactive, Present heart tones. Toco: None SVE:   A/P- 20 y.o. admitted with: Dynamic cervical changes/shortening.  Treatened preterm labor.   Present on Admission:  . Preterm labor  Pregnancy Complications: cervical shortening, funneling  Preterm labor management: bedrest advised and pelvic rest advised Dating:  4715w6d PNL Needed:  F/U US today, Final dose of Feraheme today.  FWB:  good PTL:  stable

## 2015-10-13 MED ORDER — MAGNESIUM HYDROXIDE 400 MG/5ML PO SUSP: 30.0000 mL | Freq: Every day | ORAL | Status: DC | PRN

## 2015-10-13 MED ORDER — POLYETHYLENE GLYCOL 3350 17 G PO PACK
17.0000 g | PACK | Freq: Every day | ORAL | Status: DC | PRN
  Administered 2015-10-13: 17 g via ORAL
  Filled 2015-10-13: qty 1

## 2015-10-13 MED ORDER — BISACODYL 10 MG RE SUPP
10.0000 mg | Freq: Once | RECTAL | Status: DC
Start: 2015-10-13 — End: 2015-11-24
  Filled 2015-10-13: qty 1

## 2015-10-13 NOTE — Progress Notes (Signed)
Patient ID: Catherine Golden, female   DOB: Aug 10, 1995, 20 y.o.   MRN: [id number] Hospital Day: 4519  S: No complaints.  O: Blood pressure 104/63, pulse 99, temperature 98.3 F (36.8 C), temperature source Oral, resp. rate 18, height 5' (1.524 m), weight 128 lb 14.4 oz (58.469 kg), last menstrual period 04/14/2015, SpO2 100 %.   WUJ:WJXBJYNWFHT:Baseline: 150 bpm Toco: None SVE:   A/P- 20 y.o. admitted with: Preterm cervical changes on ultrasound.  Stable.  Continue bedrest.  Present on Admission:  . Preterm labor  Pregnancy Complications: Preterm cervical changes  Preterm labor management: bedrest advised Dating:  5359w0d PNL Needed:  none FWB:  good PTL:  stable

## 2015-10-14 LAB — TYPE AND SCREEN
ABO/RH(D): B POS
ANTIBODY SCREEN: NEGATIVE

## 2015-10-14 NOTE — Progress Notes (Signed)
Patient ID: Catherine Golden, female   DOB: March 24, 1996, 20 y.o.   MRN: [id number] Vital signs normal Condition unchanged

## 2015-10-15 NOTE — Progress Notes (Signed)
Patient ID: Catherine Golden, female   DOB: [birth year], 20 y.o.   MRN: [id number] Blood pressure 120 14/54 pulse 105 respiration 18 There has been no change in her condition since yesterday

## 2015-10-16 ENCOUNTER — Encounter: Payer: Medicaid Other | Admitting: Obstetrics

## 2015-10-16 MED ORDER — POLYSACCHARIDE IRON COMPLEX 150 MG PO CAPS: 150.0000 mg | ORAL_CAPSULE | Freq: Every day | ORAL | Status: DC

## 2015-10-16 NOTE — Progress Notes (Signed)
Patient ID: Catherine Golden, female   DOB: 26-Jun-1995, 20 y.o.   MRN: [id number] Hospital Day: 4222  S: No complaints.  O: Blood pressure 107/41, pulse 96, temperature 98.4 F (36.9 C), temperature source Oral, resp. rate 20, height 5' (1.524 m), weight 128 lb 14.4 oz (58.469 kg), last menstrual period 04/14/2015, SpO2 100 %.   XLK:GMWNUUVOFHT:Baseline: 150 bpm Toco: None SVE:   A/P- 20 y.o. admitted with:  Preterm cervical changes on ultrasound.  Stable on bedrest.  Continue bedrest.  Present on Admission:  . Preterm labor  Pregnancy Complications: Preterm cervical changes  Preterm labor management: bedrest advised Dating:  5915w3d PNL Needed:  none FWB:  good PTL:  none

## 2015-10-17 ENCOUNTER — Other Ambulatory Visit: Payer: Self-pay | Admitting: Certified Nurse Midwife

## 2015-10-17 DIAGNOSIS — O99012 Anemia complicating pregnancy, second trimester: Secondary | ICD-10-CM

## 2015-10-17 LAB — TYPE AND SCREEN
ABO/RH(D): B POS
ANTIBODY SCREEN: NEGATIVE

## 2015-10-17 LAB — CBC WITH DIFFERENTIAL/PLATELET
Basophils Absolute: 0 10*3/uL (ref 0.0–0.1)
Basophils Relative: 0 %
EOS ABS: 0.2 10*3/uL (ref 0.0–0.7)
EOS PCT: 2 %
HCT: 24.5 % — ABNORMAL LOW (ref 36.0–46.0)
Hemoglobin: 8 g/dL — ABNORMAL LOW (ref 12.0–15.0)
Lymphocytes Relative: 25 %
Lymphs Abs: 1.7 10*3/uL (ref 0.7–4.0)
MCH: 25.9 pg — ABNORMAL LOW (ref 26.0–34.0)
MCHC: 32.7 g/dL (ref 30.0–36.0)
MCV: 79.3 fL (ref 78.0–100.0)
Monocytes Absolute: 0.4 10*3/uL (ref 0.1–1.0)
Monocytes Relative: 5 %
Neutro Abs: 4.6 10*3/uL (ref 1.7–7.7)
Neutrophils Relative %: 68 %
PLATELETS: 242 10*3/uL (ref 150–400)
RBC: 3.09 MIL/uL — AB (ref 3.87–5.11)
RDW: 22.9 % — ABNORMAL HIGH (ref 11.5–15.5)
WBC: 6.9 10*3/uL (ref 4.0–10.5)

## 2015-10-17 MED ORDER — FUSION PLUS PO CAPS: 1.0000 | ORAL_CAPSULE | Freq: Every day | ORAL | Status: DC

## 2015-10-17 NOTE — Progress Notes (Signed)
Patient ID: Catherine Golden, female   DOB: [birth year], 20 y.o.   MRN: [id number]  Hospital Day: 3923  HPI: admitted on 09/25/15 d/t shortened cervix via US. BMZ injections started. Magnesium drip started for neuroprotection per protocol. Bedrest with bathroom privileges. +GC 09/26/15 Dr. Clearance CootsHarper notified patient.Course of Rocephin given. Maternal Tachycardia noticed. IVF bolus given. Most likely due to IDA. MFM consult completed on 09/28/15, see note. Cervical length 0.444mm on 09/28/15. Hematology consult on 10/03/15 for anemia. IVPB Feraheme ordered given 10/05/15 and 10/12/15.   S: Preterm labor symptoms: none.  N&V with PO Iron.    O: Blood pressure 111/54, pulse 95, temperature 98.3 F (36.8 C), temperature source Oral, resp. rate 18, height 5' (1.524 m), weight 128 lb 14.4 oz (58.469 kg), last menstrual period 04/14/2015, SpO2 100 %.   WUJ:WJXBJYNWFHT:Baseline: 135 bpm, Variability: Good {> 6 bpm), Accelerations: Reactive and Decelerations: Absent Toco: None SVE:   A/P- 20 y.o. admitted with: cervical shortening, <0.1074mm of length and funneling.    Present on Admission:  . Preterm labor  Pregnancy Complications: shortened cervix with dynamic changes, threatened preterm labor  Preterm labor management: bedrest advised and pelvic rest advised Dating:  6964w4d PNL Needed:  None FWB:  good PTL:  stable

## 2015-10-19 ENCOUNTER — Other Ambulatory Visit: Payer: Self-pay | Admitting: Hematology

## 2015-10-19 NOTE — Progress Notes (Signed)
Patient ID: Catherine Golden, female   DOB: [birth year], 20 y.o.   MRN: [id number]  Hospital Day: 25  HPI: admitted on 09/25/15 d/t shortened cervix via US. BMZ injections started. Magnesium drip started for neuroprotection per protocol. Bedrest with bathroom privileges. +GC 09/26/15 Dr. Clearance CootsHarper notified patient.Course of Rocephin given. Maternal Tachycardia noticed. IVF bolus given. Most likely due to IDA. MFM consult completed on 09/28/15, see note. Cervical length 0.414mm on 09/28/15. Hematology consult on 10/03/15 for anemia. IVPB Feraheme ordered given 10/05/15 and 10/12/15.  S: Preterm labor symptoms: none.  Did have a mild HA yesterday, denies any HA today.    O: Blood pressure 114/55, pulse 82, temperature 98.3 F (36.8 C), temperature source Oral, resp. rate 18, height 5' (1.524 m), weight 130 lb 11.2 oz (59.285 kg), last menstrual period 04/14/2015, SpO2 100 %.   WNU:UVOZDGUYFHT:Baseline: 145 bpm, Variability: Good {> 6 bpm), Accelerations: Reactive and Decelerations: Absent Toco: None SVE:   A/P- 20 y.o. admitted with:  Dynamic cervical changes and cervical shortening on US.  Threatened preterm labor.   Present on Admission:  . Preterm labor  Pregnancy Complications: Cervical shortening  Preterm labor management: bedrest advised and pelvic rest advised Dating:  8783w6d PNL Needed:  none FWB:  good PTL:  stable

## 2015-10-20 ENCOUNTER — Other Ambulatory Visit: Payer: Self-pay | Admitting: Certified Nurse Midwife

## 2015-10-20 DIAGNOSIS — O99012 Anemia complicating pregnancy, second trimester: Secondary | ICD-10-CM

## 2015-10-20 LAB — RAPID HIV SCREEN (HIV 1/2 AB+AG)
HIV 1/2 ANTIBODIES: NONREACTIVE
HIV-1 P24 Antigen - HIV24: NONREACTIVE

## 2015-10-20 LAB — CBC
HEMATOCRIT: 26.6 % — AB (ref 36.0–46.0)
HEMOGLOBIN: 8.6 g/dL — AB (ref 12.0–15.0)
MCH: 26 pg (ref 26.0–34.0)
MCHC: 32.3 g/dL (ref 30.0–36.0)
MCV: 80.4 fL (ref 78.0–100.0)
Platelets: 230 10*3/uL (ref 150–400)
RBC: 3.31 MIL/uL — AB (ref 3.87–5.11)
RDW: 22.9 % — ABNORMAL HIGH (ref 11.5–15.5)
WBC: 5.3 10*3/uL (ref 4.0–10.5)

## 2015-10-20 LAB — FERRITIN: Ferritin: 421 ng/mL — ABNORMAL HIGH (ref 11–307)

## 2015-10-20 LAB — TYPE AND SCREEN
ABO/RH(D): B POS
ANTIBODY SCREEN: NEGATIVE

## 2015-10-20 LAB — IRON AND TIBC
Iron: 105 ug/dL (ref 28–170)
SATURATION RATIOS: 29 % (ref 10.4–31.8)
TIBC: 365 ug/dL (ref 250–450)
UIBC: 260 ug/dL

## 2015-10-20 MED ORDER — FUSION PLUS PO CAPS: 1.0000 | ORAL_CAPSULE | Freq: Every day | ORAL | Status: DC

## 2015-10-20 MED ORDER — VITAFOL FE+ 90-1-200 & 50 MG PO CPPK
2.0000 | ORAL_CAPSULE | Freq: Every day | ORAL | Status: DC
Start: 2015-10-21 — End: 2015-10-21

## 2015-10-20 MED ORDER — VITAFOL FE+ 90-1-200 & 50 MG PO CPPK: 2.0000 | ORAL_CAPSULE | Freq: Every day | ORAL | Status: DC

## 2015-10-20 NOTE — Progress Notes (Signed)
Patient ID: Catherine Golden, female   DOB: 09/01/1995, 20 y.o.   MRN: [id number]  Hospital Day: 6926  HPI: admitted on 09/25/15 d/t shortened cervix via US. BMZ injections started. Magnesium drip started for neuroprotection per protocol. Bedrest with bathroom privileges. +GC 09/26/15 Dr. Clearance CootsHarper notified patient.Course of Rocephin given. Maternal Tachycardia noticed. IVF bolus given. Most likely due to IDA. MFM consult completed on 09/28/15, see note. Cervical length 0.344mm on 09/28/15. Hematology consult on 10/03/15 for anemia. IVPB Feraheme ordered given 10/05/15 and 10/12/15.  S: Preterm labor symptoms: no complaints  O: Blood pressure 109/57, pulse 91, temperature 98.3 F (36.8 C), temperature source Oral, resp. rate 16, height 5' (1.524 m), weight 130 lb 11.2 oz (59.285 kg), last menstrual period 04/14/2015, SpO2 100 %.   BJY:NWGNFAOZFHT:Baseline: 140 bpm, Variability: Good {> 6 bpm), Accelerations: Reactive and Decelerations: Absent Toco: None SVE:   A/P- 20 y.o. admitted with: Iron Deficiency anemia, dynamic cervical changes with cervical shortening.    Present on Admission:  . Preterm labor  Pregnancy Complications: IDA, cervical shortening, no measurable cervix  Preterm labor management: bedrest advised and pelvic rest advised Dating:  5233w0d PNL Needed:  TOC 10/25/15, 2 hour OGTT  10/27/15, Iron studies today.    FWB:  good PTL:  stable

## 2015-10-21 NOTE — Progress Notes (Signed)
Patient ID: Catherine Golden, female   DOB: [birth year], 20 y.o.   MRN: [id number] Hospital Day: 6127  S: No complaints  O: Blood pressure 106/46, pulse 97, temperature 98.1 F (36.7 C), temperature source Oral, resp. rate 16, height 5' (1.524 m), weight 130 lb 11.2 oz (59.285 kg), last menstrual period 04/14/2015, SpO2 100 %.   YNW:GNFAOZHYFHT:Baseline: 150 bpm Toco: None SVE:   A/P- 20 y.o. admitted with: Preterm cervical shortening on ultrasound at 23 weeks.  Responded well to bedrest.  Continue bedrest.  Present on Admission:  . Preterm labor  Pregnancy Complications: Preterm cervical shortening  Preterm labor management: bedrest advised Dating:  195w1d PNL Needed:  none FWB:  good PTL:  stable

## 2015-10-22 NOTE — Progress Notes (Signed)
Patient ID: Catherine Golden, female   DOB: 1996/06/03, 20 y.o.   MRN: [id number] Hospital Day: 3428  S: No complaints.  O: Blood pressure 114/55, pulse 98, temperature 98.3 F (36.8 C), temperature source Oral, resp. rate 18, height 5' (1.524 m), weight 130 lb 11.2 oz (59.285 kg), last menstrual period 04/14/2015, SpO2 100 %.   MWN:UUVOZDGUFHT:Baseline: 140 bpm Toco: Occasional mild UC's SVE:   A/P- 20 y.o. admitted with:  Preterm cervical shortening on ultrasound in the absence of preterm labor.  Responded well to bedrest.  Continue bedrest.    Present on Admission:  . Preterm labor  Pregnancy Complications: Preterm cervical shortening on ultrasound at 23 weeks.  Preterm labor management: bedrest advised Dating:  4329w2d PNL Needed:  none FWB:  good PTL:  Stable on bedrest

## 2015-10-23 LAB — TYPE AND SCREEN
ABO/RH(D): B POS
Antibody Screen: NEGATIVE

## 2015-10-23 NOTE — Progress Notes (Signed)
Patient ID: Catherine Golden, female   DOB: 1995/11/12, 20 y.o.   MRN: [id number] Hospital Day: 4229  S: No complaints.  O: Blood pressure 121/54, pulse 95, temperature 98.2 F (36.8 C), temperature source Oral, resp. rate 18, height 5' (1.524 m), weight 130 lb 11.2 oz (59.285 kg), last menstrual period 04/14/2015, SpO2 100 %.   WUJ:WJXBJYNWFHT:Baseline: 140 bpm Toco: Occasional mild UC's SVE:   A/P- 20 y.o. admitted with: Cervical shortening on ultrasound.  Responded well to bedrest.  Continue bedrest.  Present on Admission:  . Preterm labor  Pregnancy Complications: Preterm cervical changes  Preterm labor management: bedrest advised Dating:  1638w3d PNL Needed:  none

## 2015-10-23 NOTE — Progress Notes (Signed)
Initial visit with Catherine Golden and her mother to introduce spiritual care services.  They were grateful for the visit and expect Catherine Golden to be in the hospital until she delivers in July.  Please page as further needs arise.  Maryanna ShapeAmanda M. Carley Hammedavee Lomax, M.Div. [facility] Pager [phone] Office [phone]      10/23/15 1551  Clinical Encounter Type  Visited With Patient and family together  Visit Type Initial

## 2015-10-24 ENCOUNTER — Other Ambulatory Visit: Payer: Self-pay | Admitting: Certified Nurse Midwife

## 2015-10-24 DIAGNOSIS — O99012 Anemia complicating pregnancy, second trimester: Secondary | ICD-10-CM

## 2015-10-24 MED ORDER — POLYSACCHARIDE IRON COMPLEX 150 MG PO CAPS
150.0000 mg | ORAL_CAPSULE | Freq: Two times a day (BID) | ORAL | Status: DC
  Administered 2015-10-24 – 2015-11-23 (×58): 150 mg via ORAL
  Filled 2015-10-24 (×59): qty 1

## 2015-10-24 MED ORDER — FUSION PLUS PO CAPS: 1.0000 | ORAL_CAPSULE | Freq: Every day | ORAL | Status: DC

## 2015-10-24 MED ORDER — COMPLETENATE 29-1 MG PO CHEW
1.0000 | CHEWABLE_TABLET | Freq: Every day | ORAL | Status: DC
  Administered 2015-10-24 – 2015-11-23 (×30): 1 via ORAL
  Filled 2015-10-24 (×31): qty 1

## 2015-10-24 NOTE — Progress Notes (Signed)
Patient ID: Catherine Golden, female   DOB: Nov 19, 1995, 20 y.o.   MRN: [id number]  Hospital Day: 30  HPI: admitted on 09/25/15 d/t shortened cervix via US. BMZ injections started. Magnesium drip started for neuroprotection per protocol. Bedrest with bathroom privileges. +GC 09/26/15 Dr. Clearance CootsHarper notified patient.Course of Rocephin given. Maternal Tachycardia noticed. IVF bolus given. Most likely due to IDA. MFM consult completed on 09/28/15, see note. Cervical length 0.414mm on 09/28/15. Hematology consult on 10/03/15 for anemia. IVPB Feraheme ordered given 10/05/15 and 10/12/15.Repeat labs 10/20/15: HgB: 8.6, iron studies were normal.  TOC due 10/25/15.  2 hour OGTT scheduled for 5/5.    S: Preterm labor symptoms: no complaints.    O: Blood pressure 99/45, pulse 92, temperature 98.2 F (36.8 C), temperature source Oral, resp. rate 18, height 5' (1.524 m), weight 130 lb 11.2 oz (59.285 kg), last menstrual period 04/14/2015, SpO2 100 %.   NWG:NFAOZHYQFHT:Baseline: 140 bpm, Variability: Good {> 6 bpm), Accelerations: Reactive and Decelerations: Absent Toco: None SVE:   A/P- 20 y.o. admitted with: Cervical shortening, dynamic cervical changes  Present on Admission:  . Preterm labor  Pregnancy Complications: cervical shortening/funneling  Preterm labor management: bedrest advised and pelvic rest advised Dating:  6177w4d PNL Needed:  TOC scheduled for 10/25/15, 2 Hour OGTT scheduled for 5/5.  FWB:  good PTL:  stable

## 2015-10-25 MED ORDER — FLUCONAZOLE 100 MG PO TABS
100.0000 mg | ORAL_TABLET | ORAL | Status: AC
Start: 2015-10-25 — End: 2015-10-31
  Administered 2015-10-25 – 2015-10-31 (×3): 100 mg via ORAL
  Filled 2015-10-25 (×3): qty 1

## 2015-10-25 MED ORDER — CLOTRIMAZOLE 1 % VA CREA
1.0000 | TOPICAL_CREAM | Freq: Every day | VAGINAL | Status: AC
  Administered 2015-10-25 – 2015-10-31 (×7): 1 via VAGINAL
  Filled 2015-10-25: qty 45

## 2015-10-25 NOTE — Progress Notes (Signed)
Patient ID: Catherine Golden, female   DOB: 12-09-1995, 20 y.o.   MRN: [id number]  Hospital Day: 31  HPI: admitted on 09/25/15 d/t shortened cervix via US. BMZ injections started. Magnesium drip started for neuroprotection per protocol. Bedrest with bathroom privileges. +GC 09/26/15 Dr. Clearance CootsHarper notified patient.Course of Rocephin given. Maternal Tachycardia noticed. IVF bolus given. Most likely due to IDA. MFM consult completed on 09/28/15, see note. Cervical length 0.664mm on 09/28/15. Hematology consult on 10/03/15 for anemia. IVPB Feraheme ordered given 10/05/15 and 10/12/15.Repeat labs 10/20/15: HgB: 8.6, iron studies were normal. TOC performed 10/25/15. 2 hour OGTT scheduled for 5/5.   S: Preterm labor symptoms: no complaints  O: Blood pressure 118/53, pulse 98, temperature 98.1 F (36.7 C), temperature source Oral, resp. rate 16, height 5' (1.524 m), weight 133 lb 4.8 oz (60.464 kg), last menstrual period 04/14/2015, SpO2 100 %.   QMV:HQIONGEXFHT:Baseline: 135 bpm, Variability: Good {> 6 bpm), Accelerations: Reactive and Decelerations: Absent Toco: None SVE: 1cm, posterior, about 25% effaced, ballotable.    A/P- 20 y.o. admitted with: cervical shortening, dynamic cervical changes by ultrasound  Present on Admission:  . Preterm labor  Pregnancy Complications: cervical shortening, threatned PTL  Preterm labor management: bedrest advised and pelvic rest advised Dating:  4454w5d PNL Needed:  2 hour OGTT 5/5 FWB:  Good  PTL:  stable

## 2015-10-26 LAB — GC/CHLAMYDIA PROBE AMP (~~LOC~~) NOT AT ARMC
Chlamydia: NEGATIVE
NEISSERIA GONORRHEA: NEGATIVE

## 2015-10-26 LAB — TYPE AND SCREEN
ABO/RH(D): B POS
Antibody Screen: NEGATIVE

## 2015-10-26 NOTE — Progress Notes (Signed)
Patient ID: Catherine Golden, female   DOB: 1995-10-26, 20 y.o.   MRN: [id number]  Hospital Day: 3132  HPI: admitted on 09/25/15 d/t shortened cervix via US. BMZ injections started. Magnesium drip started for neuroprotection per protocol. Bedrest with bathroom privileges. +GC 09/26/15 Dr. Clearance CootsHarper notified patient.Course of Rocephin given. Maternal Tachycardia noticed. IVF bolus given. Most likely due to IDA. MFM consult completed on 09/28/15, see note. Cervical length 0.524mm on 09/28/15. Hematology consult on 10/03/15 for anemia. IVPB Feraheme ordered given 10/05/15 and 10/12/15.Repeat labs 10/20/15: HgB: 8.6, iron studies were normal. TOC performed 10/25/15. 2 hour OGTT scheduled for 5/5.    S: No complaints.   O: Blood pressure 106/62, pulse 89, temperature 98.3 F (36.8 C), temperature source Oral, resp. rate 20, height 5' (1.524 m), weight 133 lb 4.8 oz (60.464 kg), last menstrual period 04/14/2015, SpO2 100 %.   JXB:JYNWGNFAFHT:Baseline: 145 bpm, Variability: Good {> 6 bpm), Accelerations: Reactive and Decelerations: Absent Toco: None SVE:   A/P- 20 y.o. admitted with: cervical shortening, dynamic cervical changes, chronic iron deficiency anemia   Present on Admission:  . Preterm labor  Pregnancy Complications: dynamic cervical changes, cervical shortening  Preterm labor management: bedrest advised and pelvic rest advised Dating:  7577w6d PNL Needed:  OGTT scheduled 5/5 FWB:  good PTL:  Stable with bedrest

## 2015-10-27 ENCOUNTER — Inpatient Hospital Stay (HOSPITAL_COMMUNITY): Payer: Medicaid Other

## 2015-10-27 ENCOUNTER — Encounter (HOSPITAL_COMMUNITY): Payer: Medicaid Other

## 2015-10-27 ENCOUNTER — Ambulatory Visit (HOSPITAL_COMMUNITY): Payer: Medicaid Other

## 2015-10-27 LAB — CBC WITH DIFFERENTIAL/PLATELET
Basophils Absolute: 0 10*3/uL (ref 0.0–0.1)
Basophils Relative: 0 %
EOS ABS: 0.1 10*3/uL (ref 0.0–0.7)
Eosinophils Relative: 1 %
HCT: 29.9 % — ABNORMAL LOW (ref 36.0–46.0)
HEMOGLOBIN: 9.9 g/dL — AB (ref 12.0–15.0)
LYMPHS ABS: 1.3 10*3/uL (ref 0.7–4.0)
Lymphocytes Relative: 24 %
MCH: 26.6 pg (ref 26.0–34.0)
MCHC: 33.1 g/dL (ref 30.0–36.0)
MCV: 80.4 fL (ref 78.0–100.0)
MONO ABS: 0.3 10*3/uL (ref 0.1–1.0)
MONOS PCT: 5 %
NEUTROS ABS: 3.9 10*3/uL (ref 1.7–7.7)
Neutrophils Relative %: 70 %
Other: 0 %
PLATELETS: 235 10*3/uL (ref 150–400)
RBC: 3.72 MIL/uL — AB (ref 3.87–5.11)
RDW: 22.2 % — AB (ref 11.5–15.5)
WBC: 5.6 10*3/uL (ref 4.0–10.5)

## 2015-10-27 LAB — GLUCOSE, 2 HOUR: GLUCOSE, 2 HOUR: 91 mg/dL (ref 70–139)

## 2015-10-27 LAB — GLUCOSE, FASTING: Glucose, Fasting: 82 mg/dL (ref 65–99)

## 2015-10-27 NOTE — Progress Notes (Signed)
Patient ID: Catherine Golden, female   DOB: 1996-05-31, 20 y.o.   MRN: [id number]  Hospital Day: 1533  HPI: admitted on 09/25/15 d/t shortened cervix via US. BMZ injections started. Magnesium drip started for neuroprotection per protocol. Bedrest with bathroom privileges. +GC 09/26/15 Dr. Clearance CootsHarper notified patient.Course of Rocephin given. Maternal Tachycardia noticed. IVF bolus given. Most likely due to IDA. MFM consult completed on 09/28/15, see note. Cervical length 0.144mm on 09/28/15. Hematology consult on 10/03/15 for anemia. IVPB Feraheme ordered given 10/05/15 and 10/12/15.Repeat labs 10/20/15: HgB: 8.6, iron studies were normal. TOC performed 10/25/15. 2 hour OGTT done 5/5.  S: No complaints  O: Blood pressure 122/54, pulse 96, temperature 97.9 F (36.6 C), temperature source Oral, resp. rate 20, height 5' (1.524 m), weight 133 lb 4.8 oz (60.464 kg), last menstrual period 04/14/2015, SpO2 100 %.   WUJ:WJXBJYNWFHT:Baseline: 135 bpm, Variability: Good {> 6 bpm), Accelerations: Reactive and Decelerations: Absent Toco: None SVE:   A/P- 20 y.o. admitted with: dynamic cervical changes and shortening.    Present on Admission:  . Preterm labor  Pregnancy Complications: Chronic iron difficency anemia, dynamic cervical changes  Preterm labor management: bedrest advised and pelvic rest advised Dating:  7225w0d PNL Needed:  none FWB:  good PTL:  stable

## 2015-10-27 NOTE — Progress Notes (Signed)
MATERNAL FETAL MEDICINE CONSULT  Patient Name: Catherine Golden Medical Record Number:  [id number] Date of Birth: 28-Dec-1995 Requesting Physician Name:  Brock Badharles A Harper, MD Date of Service: 10/27/2015  Chief Complaint Short cervix  History of Present Illness Catherine Golden was seen today secondary to a short cervix at the request of Brock Badharles A Harper, MD.  The patient is a 20 y.o. G1P0000,at 6620w0d with an EDD of 01/19/2016, by Last Menstrual Period dating method.  Catherine Golden has been hospitalized for approximately 30 days due to a short cervix discovered earlier in pregnancy.  She reports some lower abdominal pressure and a stretching sensation in her inguinal region bilaterally.  She denies vaginal bleeding, loss of fluid, or contractions.  Review of Systems Pertinent items are noted in HPI.  Patient History OB History  Gravida Para Term Preterm AB SAB TAB Ectopic Multiple Living  1 0 0 0 0 0 0 0 0 0     # Outcome Date GA Lbr Len/2nd Weight Sex Delivery Anes PTL Lv  1 Current               Past Medical History  Diagnosis Date  . Asthma     Past Surgical History  Procedure Laterality Date  . Wisdom tooth extraction      Social History   Social History  . Marital Status: Single    Spouse Name: N/A  . Number of Children: N/A  . Years of Education: N/A   Social History Main Topics  . Smoking status: Passive Smoke Exposure - Never Smoker  . Smokeless tobacco: Never Used  . Alcohol Use: No  . Drug Use: No  . Sexual Activity:    Partners: Male    Birth Control/ Protection: None   Other Topics Concern  . None   Social History Narrative    Family History  Problem Relation Age of Onset  . Hypertension Father   . Diabetes Paternal Grandmother   . Hypertension Paternal Grandmother   . Cancer Neg Hx   . Hypertension Paternal Grandfather   . Cystic fibrosis Paternal Grandfather   . Diabetes Mellitus II Paternal Grandfather    In addition, the patient  has no family history of mental retardation, birth defects, or genetic diseases.  Physical Examination Filed Vitals:   10/27/15 0833 10/27/15 1154  BP: 93/47 122/54  Pulse: 92 96  Temp: 98.2 F (36.8 C) 97.9 F (36.6 C)  Resp: 18 20   General appearance - alert, well appearing, and in no distress Mental status - alert, oriented to person, place, and time Abdomen - soft, nontender, nondistended, no masses or organomegaly Extremities - no pedal edema noted  Assessment and Recommendations 1.  Short cervix.  The external os appears dilated and fetal membranes are seen protruding up to the external os.  I recommend performing a speculum exam.  If membranes are exposed to the vagina, I strongly recommend Catherine Golden remain hospitalized until 32 weeks before considering discharging her home.  If the cervix appears close and no fetal membranes are exposed into the vagina continued inpatient management and managing Catherine Golden as an outpatient with activity restriction are both reasonable options.  I recommend against strict bedrest as this does not prevent or delay preterm delivery in patients with cervical shortening or incompetence and carries the risk of DVT and deconditioning.  I spent 15 minutes with [name] today of which 50% was face-to-face counseling.  [facility] you [facility].  Please do not hesitate to contact us with questions.   Rema Fendt, MD

## 2015-10-28 MED ORDER — HYDROCORTISONE ACETATE 25 MG RE SUPP
25.0000 mg | Freq: Two times a day (BID) | RECTAL | Status: DC | PRN
  Filled 2015-10-28: qty 1

## 2015-10-28 NOTE — Progress Notes (Signed)
Patient ID: Catherine Golden, female   DOB: 06/07/1996, 20 y.o.   MRN: [id number] Blood pressure 117/45 pulse 93 respiration 18 Condition unchanged

## 2015-10-29 LAB — TYPE AND SCREEN
ABO/RH(D): B POS
Antibody Screen: NEGATIVE

## 2015-10-29 NOTE — Progress Notes (Signed)
Patient ID: Catherine Golden, female   DOB: [birth year], 20 y.o.   MRN: [id number] Vital signs normal Condition unchanged

## 2015-10-30 MED ORDER — HYDROCORTISONE 1 % EX CREA: TOPICAL_CREAM | CUTANEOUS | Status: DC | PRN

## 2015-10-30 NOTE — Progress Notes (Signed)
Patient ID: Catherine Golden, female   DOB: 07-12-1995, 20 y.o.   MRN: [id number] Hospital Day: 7236  S: Preterm labor symptoms: low back pain and pelvic pressure  O: Blood pressure 97/44, pulse 103, temperature 98.3 F (36.8 C), temperature source Oral, resp. rate 18, height 5' (1.524 m), weight 133 lb 4.8 oz (60.464 kg), last menstrual period 04/14/2015, SpO2 100 %.   KGM:WNUUVOZDFHT:Baseline: 150 bpm Toco: None SVE:   A/P- 20 y.o. admitted with:  Preterm cervical effacement on ultrasound, not in labor.  Stable on bedrest.  Continue bedrest in hospital until 32 weeks.  Then discharge home on modified rest.  Present on Admission:  . Preterm labor  Pregnancy Complications: none  Preterm labor management: Preterm cervical changes in the abscence of labor Dating:  6511w3d PNL Needed:  None FWB:  good PTL:  stable

## 2015-10-31 NOTE — Progress Notes (Signed)
Patient ID: Catherine Golden, female   DOB: 08-25-1995, 20 y.o.   MRN: [id number]  Hospital Day: 37  HPI: admitted on 09/25/15 d/t shortened cervix via US. BMZ injections started. Magnesium drip started for neuroprotection per protocol. Bedrest with bathroom privileges. +GC 09/26/15 Dr. Clearance CootsHarper notified patient.Course of Rocephin given. Maternal Tachycardia noticed. IVF bolus given. Most likely due to IDA. MFM consult completed on 09/28/15, see note. Cervical length 0.344mm on 09/28/15. Hematology consult on 10/03/15 for anemia. IVPB Feraheme ordered given 10/05/15 and 10/12/15.Repeat labs 10/20/15: HgB: 8.6, iron studies were normal. TOC performed 10/25/15. 2 hour OGTT done 5/5, normal.  F/U US on 5/5 demonstrated cervical dilation with ?membranes towards the canal.  Decision to keep patient admitted until at least 32 weeks and reevaluate her prognosis at that time; if stable d/c around 32 weeks.    S: Preterm labor symptoms:No complaints.  O: Blood pressure 121/52, pulse 90, temperature 98.4 F (36.9 C), temperature source Oral, resp. rate 18, height 5' (1.524 m), weight 133 lb 4.8 oz (60.464 kg), last menstrual period 04/14/2015, SpO2 100 %.   NWG:NFAOZHYQFHT:Baseline: 140 bpm, Variability: Good {> 6 bpm), Accelerations: Reactive and Decelerations: Absent Toco: None SVE:   A/P- 20 y.o. admitted with:  Dynamic cervical changes, cervical shortening and threatened preterm labor  Present on Admission:  . Preterm labor  Pregnancy Complications: dynamic cervical changes, shortenening and threatened PTL, chronic iron deficiency anemia  Preterm labor management: bedrest advised and pelvic rest advised Dating:  5481w4d PNL Needed:  none FWB:  good PTL:  stable

## 2015-11-01 LAB — TYPE AND SCREEN
ABO/RH(D): B POS
Antibody Screen: NEGATIVE

## 2015-11-01 NOTE — Progress Notes (Signed)
Patient ID: Catherine Golden, female   DOB: [birth year], 20 y.o.   MRN: [id number]  Hospital Day: 4638  HPI: admitted on 09/25/15 d/t shortened cervix via US. BMZ injections started. Magnesium drip started for neuroprotection per protocol. Bedrest with bathroom privileges. +GC 09/26/15 Dr. Clearance CootsHarper notified patient.Course of Rocephin given. Maternal Tachycardia noticed. IVF bolus given. Most likely due to IDA. MFM consult completed on 09/28/15, see note. Cervical length 0.484mm on 09/28/15. Hematology consult on 10/03/15 for anemia. IVPB Feraheme ordered given 10/05/15 and 10/12/15.Repeat labs 10/20/15: HgB: 8.6, iron studies were normal. TOC performed 10/25/15. 2 hour OGTT done 5/5, normal, HGB 9.9. F/U US on 5/5 demonstrated cervical dilation with ?membranes towards the canal. Decision to keep patient admitted until at least 32 weeks and reevaluate her prognosis at that time; if stable d/c around 32 weeks.   S: Preterm labor symptoms: no complaints  O: Blood pressure 110/53, pulse 91, temperature 98.5 F (36.9 C), temperature source Oral, resp. rate 18, height 5' (1.524 m), weight 133 lb 4.8 oz (60.464 kg), last menstrual period 04/14/2015, SpO2 100 %.   VFI:EPPIRJJOFHT:Baseline: 140 bpm, Variability: Good {> 6 bpm), Accelerations: Reactive and Decelerations: Variable: mild Toco: None SVE:   A/P- 20 y.o. admitted with: dynamic cervical changes, cervical shortening, chronic iron deficiency anemia.   Present on Admission:  . Preterm labor  Pregnancy Complications: dynamic cervical changes, cervical shortening, chronic iron deficiency anemia.   Preterm labor management: bedrest advised and pelvic rest advised Dating:  629w5d PNL Needed:  none FWB:  Good  PTL:  stable

## 2015-11-02 NOTE — Progress Notes (Signed)
Patient ID: Catherine Golden, female   DOB: December 15, 1995, 20 y.o.   MRN: [id number] Hospital Day: 7939  S: No complaints.  O: Blood pressure 115/63, pulse 86, temperature 98.4 F (36.9 C), temperature source Oral, resp. rate 18, height 5' (1.524 m), weight 133 lb 12.8 oz (60.691 kg), last menstrual period 04/14/2015, SpO2 100 %.   WUJ:WJXBJYNWFHT:Baseline: 150 bpm Toco: None SVE:   A/P- 20 y.o. admitted with:  Preterm cervical changes.  Stable on bedrest.  Continue bedrest in hospital until 32 weeks..  Present on Admission:  . Preterm labor  Pregnancy Complications: Preterm cervical changes  Preterm labor management: no treatment necessary Dating:  6474w6d PNL Needed:  none FWB:  good PTL:  stable

## 2015-11-02 NOTE — Progress Notes (Signed)
Maternal pulse tracing from [phone]

## 2015-11-03 NOTE — Progress Notes (Signed)
Was present for exam with Catherine Golden.  Mother of patient was present in the room upon arrival with Dr. Clearance CootsHarper.  Patient's mother was very aggressive and upset over the policy of just having the patient in the bed.  Dr. Clearance CootsHarper reinforced the policy of patient is to be the only one in the bed.  Patients mother states that she does not want her daughter labeled.  Dr. Clearance CootsHarper stated that no one is labeling her daughter and stated that all parties need to be aware of the policy and abide by it.  Patients mother was wanting her daughter discharged. Dr. Clearance CootsHarper stated the reasons why she was admitted and that discharge is not in the best interest of her daughter at this time.  Mother of the patient left a few moments later.  Patient was upset by the interactions, she states that she understands the policy.

## 2015-11-03 NOTE — Progress Notes (Signed)
Patient ID: Catherine JuneShikia D Golden, female   DOB: [birth year], 20 y.o.   MRN: [id number]  Hospital Day: 40  HPI: admitted on 09/25/15 d/t shortened cervix via US. BMZ injections started. Magnesium drip started for neuroprotection per protocol. Bedrest with bathroom privileges. +GC 09/26/15 Dr. Clearance CootsHarper notified patient.Course of Rocephin given. Maternal Tachycardia noticed. IVF bolus given. Most likely due to IDA. MFM consult completed on 09/28/15, see note. Cervical length 0.744mm on 09/28/15. Hematology consult on 10/03/15 for anemia. IVPB Feraheme ordered given 10/05/15 and 10/12/15.Repeat labs 10/20/15: HgB: 8.6, iron studies were normal. TOC performed 10/25/15. 2 hour OGTT done 5/5, normal, HGB 9.9. F/U US on 5/5 demonstrated cervical dilation with ?membranes towards the canal. Decision to keep patient admitted until at least 32 weeks and reevaluate her prognosis at that time; if stable d/c around 32 weeks.    S: Preterm labor symptoms: no complaints  O: Blood pressure 109/63, pulse 96, temperature 98 F (36.7 C), temperature source Oral, resp. rate 20, height 5' (1.524 m), weight 133 lb 12.8 oz (60.691 kg), last menstrual period 04/14/2015, SpO2 100 %.   NWG:NFAOZHYQFHT:Baseline: 140 bpm, Variability: Good {> 6 bpm), Accelerations: Reactive and Decelerations: Absent Toco: None SVE:   A/P- 20 y.o. admitted with: threatened preterm labor, dynamic cervical changes, cervical shortening  Present on Admission:  . Preterm labor  Pregnancy Complications:  threatened preterm labor, dynamic cervical changes, cervical shortening, and chronic anemia  Preterm labor management: bedrest advised and pelvic rest advised Dating:  425w0d PNL Needed:  none FWB:  good PTL:  stable

## 2015-11-04 LAB — TYPE AND SCREEN
ABO/RH(D): B POS
ANTIBODY SCREEN: NEGATIVE

## 2015-11-04 NOTE — Progress Notes (Signed)
Patient ID: Catherine Golden, female   DOB: September 27, 1995, 20 y.o.   MRN: [id number] Hospital Day: 7741  S: Preterm labor symptoms:  No complaints  O: Blood pressure 82/35, pulse 112, temperature 98.2 F (36.8 C), temperature source Oral, resp. rate 18, height 5' (1.524 m), weight 133 lb 12.8 oz (60.691 kg), last menstrual period 04/14/2015, SpO2 100 %.   NWG:NFAOZHYQFHT:Baseline: 150 bpm Toco: None SVE:   A/P- 20 y.o. admitted with: Preterm cervical changes on ultrasound, no contractions.  Doing well on bedrest.  Continue bedrest in hospital until 32 weeks, per MFM recommendation.  Present on Admission:  . Preterm labor  Pregnancy Complications: Preterm cervical changes  Preterm labor management: bedrest advised Dating:  8780w1d PNL Needed:  none FWB:  good PTL:  stable

## 2015-11-05 NOTE — Progress Notes (Signed)
Patient ID: Catherine Golden, female   DOB: 07/30/1995, 20 y.o.   MRN: [id number] Hospital Day: 4942  S: No complaints  O: Blood pressure 99/47, pulse 97, temperature 98.2 F (36.8 C), temperature source Oral, resp. rate 18, height 5' (1.524 m), weight 133 lb 12.8 oz (60.691 kg), last menstrual period 04/14/2015, SpO2 100 %.   WUJ:WJXBJYNWFHT:Baseline: 150 bpm Toco: None SVE:    Present on Admission:  . Preterm labor  Pregnancy Complications: Preterm cervical changes  Preterm labor management: bedrest advised Dating:  2428w2d PNL Needed:  None FWB:  good PTL:  stable

## 2015-11-07 LAB — TYPE AND SCREEN
ABO/RH(D): B POS
ANTIBODY SCREEN: NEGATIVE

## 2015-11-08 NOTE — Progress Notes (Signed)
CSW consult acknowledged.  CSW spoke with patient in her room to offer emotional support and assist as needed. Patient states she is doing well and has strong network of support. Patient states "in the beginning, I was moody and depressed, but I am ok now." Patient states she is passing time by reading and sleeping. Patient is oldest of 4 children, reports her mother, father, and other extended family visiting regularly.  CSW asked about events of yesterday (mother was angry, asking for patient to be discharged). Patient denied any problems, "everything has been good!" No needs expressed. CSW will assist as needed.  Gerrie NordmannMichelle Barrett-Hilton, LCSW [phone]

## 2015-11-09 NOTE — Progress Notes (Signed)
Patient ID: Catherine Golden, female   DOB: 17-Apr-1996, 20 y.o.   MRN: [id number]  Hospital Day: 5646  HPI: admitted on 09/25/15 d/t shortened cervix via US. BMZ injections started. Magnesium drip started for neuroprotection per protocol. Bedrest with bathroom privileges. +GC 09/26/15 Dr. Clearance CootsHarper notified patient.Course of Rocephin given. Maternal Tachycardia noticed. IVF bolus given. Most likely due to IDA. MFM consult completed on 09/28/15, see note. Cervical length 0.654mm on 09/28/15. Hematology consult on 10/03/15 for anemia. IVPB Feraheme ordered given 10/05/15 and 10/12/15.Repeat labs 10/20/15: HgB: 8.6, iron studies were normal. TOC performed 10/25/15. 2 hour OGTT done 5/5, normal, HGB 9.9. F/U US on 5/5 demonstrated cervical dilation with ?membranes towards the canal. Decision to keep patient admitted until at least 32 weeks and reevaluate her prognosis at that time; if stable d/c around 32 weeks.   S: No complaints  O: Blood pressure 104/56, pulse 96, temperature 98 F (36.7 C), temperature source Oral, resp. rate 18, height 5' (1.524 m), weight 133 lb 9.6 oz (60.601 kg), last menstrual period 04/14/2015, SpO2 100 %.   WUJ:WJXBJYNWFHT:Baseline: 135 bpm, Variability: Good {> 6 bpm), Accelerations: Reactive and Decelerations: Absent Toco: None SVE:   A/P- 20 y.o. admitted with:  Threatened preterm labor, chronic anemia, dynamic cervical changes  Present on Admission:  . Preterm labor  Pregnancy Complications: dynamic cervical changes  Preterm labor management: bedrest advised and pelvic rest advised Dating:  6878w6d PNL Needed:  none FWB:  Good PTL:  stable

## 2015-11-09 NOTE — Progress Notes (Signed)
Patient ID: Catherine JuneShikia D Golden, female   DOB: 03-29-1996, 20 y.o.   MRN: [id number]  Hospital Day: 45  HPI: admitted on 09/25/15 d/t shortened cervix via US. BMZ injections started. Magnesium drip started for neuroprotection per protocol. Bedrest with bathroom privileges. +GC 09/26/15 Dr. Clearance CootsHarper notified patient.Course of Rocephin given. Maternal Tachycardia noticed. IVF bolus given. Most likely due to IDA. MFM consult completed on 09/28/15, see note. Cervical length 0.44mm on 09/28/15. Hematology consult on 10/03/15 for anemia. IVPB Feraheme ordered given 10/05/15 and 10/12/15.Repeat labs 10/20/15: HgB: 8.6, iron studies were normal. TOC performed 10/25/15. 2 hour OGTT done 5/5, normal, HGB 9.9. F/U US on 5/5 demonstrated cervical dilation with ?membranes towards the canal. Decision to keep patient admitted until at least 32 weeks and reevaluate her prognosis at that time; if stable d/c around 32 weeks.   S: No complaints  O: Blood pressure 104/56, pulse 96, temperature 98 F (36.7 C), temperature source Oral, resp. rate 18, height 5' (1.524 m), weight 133 lb 9.6 oz (60.601 kg), last menstrual period 04/14/2015, SpO2 100 %.   WUJ:WJXBJYNWFHT:Baseline: 145 bpm, Variability: Good {> 6 bpm), Accelerations: Reactive and Decelerations: Absent Toco: None SVE:   A/P- 20 y.o. admitted with:  Present on Admission:  . Preterm labor  Pregnancy Complications: preterm labor  and dynamic cervical changes, chronic anemia  Preterm labor management: bedrest advised and pelvic rest advised Dating:  2676w6d PNL Needed:  none FWB:  good PTL:  stable

## 2015-11-09 NOTE — Progress Notes (Signed)
Patient ID: Catherine Golden, female   DOB: [birth year], 20 y.o.   MRN: [id number]  Hospital Day: #44  HPI: admitted on 09/25/15 d/t shortened cervix via US. BMZ injections started. Magnesium drip started for neuroprotection per protocol. Bedrest with bathroom privileges. +GC 09/26/15 Dr. Clearance CootsHarper notified patient.Course of Rocephin given. Maternal Tachycardia noticed. IVF bolus given. Most likely due to IDA. MFM consult completed on 09/28/15, see note. Cervical length 0.124mm on 09/28/15. Hematology consult on 10/03/15 for anemia. IVPB Feraheme ordered given 10/05/15 and 10/12/15.Repeat labs 10/20/15: HgB: 8.6, iron studies were normal. TOC performed 10/25/15. 2 hour OGTT done 5/5, normal, HGB 9.9. F/U US on 5/5 demonstrated cervical dilation with ?membranes towards the canal. Decision to keep patient admitted until at least 32 weeks and reevaluate her prognosis at that time; if stable d/c around 32 weeks.   S: No complaints  O: Blood pressure 104/56, pulse 96, temperature 98 F (36.7 C), temperature source Oral, resp. rate 18, height 5' (1.524 m), weight 133 lb 9.6 oz (60.601 kg), last menstrual period 04/14/2015, SpO2 100 %.   WUJ:WJXBJYNWFHT:Baseline: 140 bpm, Variability: Good {> 6 bpm), Accelerations: Reactive and Decelerations: Absent Toco: None SVE:   A/P- 20 y.o. admitted with:  threatened preterm labor, dynamic cervical changes, chronic anemia  Present on Admission:  . Preterm labor  Pregnancy Complications: dynamic cervical changes, chronic anemia  Preterm labor management: bedrest advised and pelvic rest advised Dating:  2988w6d PNL Needed:  none FWB:  good PTL:  stable

## 2015-11-09 NOTE — Progress Notes (Signed)
Egg crate ordered by Orvilla Cornwallachelle Denney CNM.

## 2015-11-10 LAB — TYPE AND SCREEN
ABO/RH(D): B POS
ANTIBODY SCREEN: NEGATIVE

## 2015-11-10 NOTE — Progress Notes (Signed)
Follow up visit with Shakia to offer spiritual support.  Pt reports she is doing okay and laughingly stated she had no other choice.  She had company during our visit and reports she has good support.    She is aware of how to connect with spiritual care services.  Please page as further needs arise.  Maryanna ShapeAmanda M. Carley Hammedavee Lomax, M.Div. [facility] Pager [phone] Office [phone]

## 2015-11-10 NOTE — Progress Notes (Signed)
Patient ID: Catherine JuneShikia D Jorden, female   DOB: Sep 12, 1995, 20 y.o.   MRN: [id number]  Hospital Day: 2147  HPI: admitted on 09/25/15 d/t shortened cervix via US. BMZ injections started. Magnesium drip started for neuroprotection per protocol. Bedrest with bathroom privileges. +GC 09/26/15 Dr. Clearance CootsHarper notified patient.Course of Rocephin given. Maternal Tachycardia noticed. IVF bolus given. Most likely due to IDA. MFM consult completed on 09/28/15, see note. Cervical length 0.574mm on 09/28/15. Hematology consult on 10/03/15 for anemia. IVPB Feraheme ordered given 10/05/15 and 10/12/15.Repeat labs 10/20/15: HgB: 8.6, iron studies were normal. TOC performed 10/25/15. 2 hour OGTT done 5/5, normal, HGB 9.9. F/U US on 5/5 demonstrated cervical dilation with ?membranes towards the canal. Decision to keep patient admitted until at least 32 weeks and reevaluate her prognosis at that time; if stable d/c around 32 weeks.   S: No complaints. States that the egg crate is helping.    O: Blood pressure 95/65, pulse 100, temperature 98.2 F (36.8 C), temperature source Oral, resp. rate 16, height 5' (1.524 m), weight 133 lb 9.6 oz (60.601 kg), last menstrual period 04/14/2015, SpO2 100 %.   HQI:ONGEXBMWFHT:Baseline: 135 bpm, Variability: Good {> 6 bpm), Accelerations: Reactive and Decelerations: Absent Toco: None SVE:   A/P- 20 y.o. admitted with: threatened preterm labor, dynamic cervical changes, cervical shortening.    Present on Admission:  . Preterm labor  Pregnancy Complications: threatened preterm labor, dynamic cervical changes, cervical shortening.   Preterm labor management: bedrest advised and pelvic rest advised Dating:  3844w0d PNL Needed:  None, encourage breast feeding and parenting classes while hospitalized.   FWB:  good PTL:  stable

## 2015-11-12 NOTE — Progress Notes (Signed)
Patient ID: Catherine Golden, female   DOB: [birth year], 20 y.o.   MRN: [id number] Hospital Day: 5949  S:  No complaints.  O: Blood pressure 119/66, pulse 90, temperature 98.3 F (36.8 C), temperature source Oral, resp. rate 18, height 5' (1.524 m), weight 133 lb 9.6 oz (60.601 kg), last menstrual period 04/14/2015, SpO2 100 %.   WUJ:WJXBJYNWFHT:Baseline: 135 bpm Toco: None SVE:   A/P- 20 y.o. admitted with:Ptreterm cervical changes.  Stable on bedrest.  Continue bedrest.  Present on Admission:  . Preterm labor

## 2015-11-13 LAB — TYPE AND SCREEN
ABO/RH(D): B POS
ANTIBODY SCREEN: NEGATIVE

## 2015-11-13 NOTE — Progress Notes (Signed)
Patient ID: Catherine Golden, female   DOB: 1996/05/31, 20 y.o.   MRN: [id number] Hospital Day: 6750  S: Preterm labor symptoms: Preterm cervical changes.  O: Blood pressure 120/55, pulse 96, temperature 98.5 F (36.9 C), temperature source Oral, resp. rate 16, height 5' (1.524 m), weight 133 lb 9.6 oz (60.601 kg), last menstrual period 04/14/2015, SpO2 100 %.   WUJ:WJXBJYNWFHT:Baseline: 140 bpm Toco: None SVE:   A/P- 20 y.o. admitted with:  Preterm cervical changes.  Stable on bedrest.  Continue bedrest.  Present on Admission:  . Preterm labor  Pregnancy Complications: Preterm cervical changes  Preterm labor management: bedrest advised and pelvic rest advised Dating:  6622w3d PNL Needed:  none FWB:  good PTL:  stable

## 2015-11-14 NOTE — Progress Notes (Signed)
Patient ID: Catherine Golden, female   DOB: 30-May-1996, 20 y.o.   MRN: [id number]  Hospital Day: 4351  HPI: admitted on 09/25/15 d/t shortened cervix via US. BMZ injections started. Magnesium drip started for neuroprotection per protocol. Bedrest with bathroom privileges. +GC 09/26/15 Dr. Clearance CootsHarper notified patient.Course of Rocephin given. Maternal Tachycardia noticed. IVF bolus given. Most likely due to IDA. MFM consult completed on 09/28/15, see note. Cervical length 0.424mm on 09/28/15. Hematology consult on 10/03/15 for anemia. IVPB Feraheme ordered given 10/05/15 and 10/12/15.Repeat labs 10/20/15: HgB: 8.6, iron studies were normal. TOC performed 10/25/15. 2 hour OGTT done 5/5, normal, HGB 9.9. F/U US on 5/5 demonstrated cervical dilation with ?membranes towards the canal. Decision to keep patient admitted until at least 32 weeks and reevaluate her prognosis at that time; if stable d/c around 32 weeks.  S: No complaints.  Possible discharge in a few weeks discussed.  Breastfeeding and labor classes encouraged.    O: Blood pressure 113/55, pulse 100, temperature 98.2 F (36.8 C), temperature source Oral, resp. rate 18, height 5' (1.524 m), weight 133 lb 9.6 oz (60.601 kg), last menstrual period 04/14/2015, SpO2 100 %.   WUJ:WJXBJYNWFHT:Baseline: 130 bpm, Variability: Good {> 6 bpm), Accelerations: Reactive and Decelerations: Absent Toco: None SVE:   A/P- 20 y.o. admitted with: threatened preterm labor, dynamic cervical changes, and cervical shortening. Chronic Anemia.    Present on Admission:  . Preterm labor  Pregnancy Complications: threatened preterm labor, dynamic cervical changes, and cervical shortening. Chronic anemia.   Preterm labor management: bedrest advised and pelvic rest advised Dating:  2437w4d PNL Needed:  none FWB:  good PTL:  stable

## 2015-11-15 NOTE — Progress Notes (Signed)
Patient ID: Catherine Golden, female   DOB: 06-Aug-1995, 20 y.o.   MRN: [id number] Hospital Day: 6652  S: No complaints.  O: Blood pressure 122/66, pulse 100, temperature 98 F (36.7 C), temperature source Oral, resp. rate 18, height 5' (1.524 m), weight 133 lb 9.6 oz (60.601 kg), last menstrual period 04/14/2015, SpO2 100 %.   WUJ:WJXBJYNWFHT:Baseline: 145 bpm Toco: None SVE:   A/P- 20 y.o. admitted with:  Preterm cervical changes.  Stable on bedrest.  Continue bedrest.  Present on Admission:  . Preterm labor  Pregnancy Complications: Preterm cervical changes  Preterm labor management: bedrest advised Dating:  561w5d PNL Needed:  none FWB:  good PTL:  stable

## 2015-11-15 NOTE — Progress Notes (Signed)
Patient ID: Catherine Golden, female   DOB: 15-Mar-1996, 20 y.o.   MRN: [id number]

## 2015-11-16 LAB — CBC
HCT: 29.2 % — ABNORMAL LOW (ref 36.0–46.0)
Hemoglobin: 9.8 g/dL — ABNORMAL LOW (ref 12.0–15.0)
MCH: 27.1 pg (ref 26.0–34.0)
MCHC: 33.6 g/dL (ref 30.0–36.0)
MCV: 80.9 fL (ref 78.0–100.0)
Platelets: 217 10*3/uL (ref 150–400)
RBC: 3.61 MIL/uL — ABNORMAL LOW (ref 3.87–5.11)
RDW: 20.1 % — AB (ref 11.5–15.5)
WBC: 6.9 10*3/uL (ref 4.0–10.5)

## 2015-11-16 LAB — TYPE AND SCREEN
ABO/RH(D): B POS
Antibody Screen: NEGATIVE

## 2015-11-16 NOTE — Progress Notes (Signed)
Patient ID: Catherine Golden, female   DOB: [birth year], 20 y.o.   MRN: [id number]  Hospital Day: 10253  HPI: admitted on 09/25/15 d/t shortened cervix via US. BMZ injections started. Magnesium drip started for neuroprotection per protocol. Bedrest with bathroom privileges. +GC 09/26/15 Dr. Clearance CootsHarper notified patient.Course of Rocephin given. Maternal Tachycardia noticed. IVF bolus given. Most likely due to IDA. MFM consult completed on 09/28/15, see note. Cervical length 0.194mm on 09/28/15. Hematology consult on 10/03/15 for anemia. IVPB Feraheme ordered given 10/05/15 and 10/12/15.Repeat labs 10/20/15: HgB: 8.6, iron studies were normal. TOC performed 10/25/15. 2 hour OGTT done 5/5, normal, HGB 9.9. F/U US on 5/5 demonstrated cervical dilation with ?membranes towards the canal. Decision to keep patient admitted until at least 32 weeks and reevaluate her prognosis at that time; if stable d/c around 32 weeks.  Patient aware of POC.  Patient did take breastfeeding class on 11/15/15.    S: Preterm labor symptoms: No complaints.   O: Blood pressure 109/44, pulse 110, temperature 98.6 F (37 C), temperature source Oral, resp. rate 20, height 5' (1.524 m), weight 134 lb 14.4 oz (61.19 kg), last menstrual period 04/14/2015, SpO2 100 %.   YQM:VHQIONGEFHT:Baseline: 140 bpm, Variability: Good {> 6 bpm), Accelerations: Reactive and Decelerations: Absent Toco: None SVE:   A/P- 20 y.o. admitted with: threatened preterm labor, dynamic cervical changes, cervical shortening.    Present on Admission:  . Preterm labor  Pregnancy Complications: threatened preterm labor, dynamic cervical changes, cervical shortening, chronic anemia  Preterm labor management: bedrest advised and pelvic rest advised Dating:  [redacted]w[redacted]d PNL Needed:  none FWB:  good PTL:  stable

## 2015-11-17 NOTE — Progress Notes (Signed)
Patient ID: Catherine Golden, female   DOB: [birth year], 20 y.o.   MRN: [id number] Hospital Day: 6254  S: Preterm labor symptoms: None.  Had preterm cervical effacement and funneling on routine `ultrasound for anatomy.   O: Blood pressure 116/62, pulse 103, temperature 98.4 F (36.9 C), temperature source Oral, resp. rate 18, height 5' (1.524 m), weight 134 lb 14.4 oz (61.19 kg), last menstrual period 04/14/2015, SpO2 100 %.   NWG:NFAOZHYQFHT:Baseline: 150 bpm Toco: None SVE:   A/P- 20 y.o. admitted with:  Preterm cervical changes on routine anatomy ultrasound.  Doing well on bedrest.  Continue bedrest in hospital until 32 weeks.   .    Present on Admission:  . Preterm labor  Pregnancy Complications: none  Preterm labor management: modified bedrest advised Dating:  5873w0d PNL Needed:  none FWB:  good PTL:  stable

## 2015-11-18 NOTE — Progress Notes (Signed)
Patient ID: Catherine Golden, female   DOB: 10/12/1995, 20 y.o.   MRN: [id number] Blood pressure 125/67 pulse 95 respirations 16 Condition unchanged doing well

## 2015-11-19 LAB — TYPE AND SCREEN
ABO/RH(D): B POS
Antibody Screen: NEGATIVE

## 2015-11-19 NOTE — Progress Notes (Signed)
Patient ID: Catherine Golden, female   DOB: [birth year], 20 y.o.   MRN: [id number] Vital signs normal History shot cervix no complaints

## 2015-11-21 NOTE — Progress Notes (Signed)
Patient ID: Catherine Golden, female   DOB: 01-25-1996, 20 y.o.   MRN: [id number]  Hospital Day: 2558  HPI: admitted on 09/25/15 d/t shortened cervix via US. BMZ injections started. Magnesium drip started for neuroprotection per protocol. Bedrest with bathroom privileges. +GC 09/26/15 Dr. Clearance CootsHarper notified patient.Course of Rocephin given. Maternal Tachycardia noticed. IVF bolus given. Most likely due to IDA. MFM consult completed on 09/28/15, see note. Cervical length 0.414mm on 09/28/15. Hematology consult on 10/03/15 for anemia. IVPB Feraheme ordered given 10/05/15 and 10/12/15.Repeat labs 10/20/15: HgB: 8.6, iron studies were normal. TOC performed 10/25/15. 2 hour OGTT done 5/5, normal, HGB 9.9. F/U US on 5/5 demonstrated cervical dilation with ?membranes towards the canal. Decision to keep patient admitted until at least 32 weeks and reevaluate her prognosis at that time; if stable d/c around 32 weeks. Patient aware of POC. Patient did take breastfeeding class on 11/15/15.   S: C/O slight arm numbness bilaterally.  Discussed decreased salt intake.  No edema noted otherwise.    O: Blood pressure 122/61, pulse 104, temperature 98.5 F (36.9 C), temperature source Oral, resp. rate 18, height 5' (1.524 m), weight 134 lb 14.4 oz (61.19 kg), last menstrual period 04/14/2015, SpO2 100 %.   XBJ:YNWGNFAOFHT:Baseline: 135 bpm, Variability: Good {> 6 bpm), Accelerations: Reactive and Decelerations: Absent Toco: None SVE:   A/P- 20 y.o. admitted with: dynamic cervical changes, cervical shortening, chronic anemia.   Present on Admission:  . Preterm labor  Pregnancy Complications: dynamic cervical changes, cervical shortening, chronic anemia.  Preterm labor management: bedrest advised and pelvic rest advised Dating:  2325w4d PNL Needed:  F/U US FWB:  good PTL:  stable

## 2015-11-22 ENCOUNTER — Inpatient Hospital Stay (HOSPITAL_COMMUNITY): Payer: Medicaid Other

## 2015-11-22 LAB — TYPE AND SCREEN
ABO/RH(D): B POS
Antibody Screen: NEGATIVE

## 2015-11-23 NOTE — Progress Notes (Signed)
Patient ID: Estelle JuneShikia D Band, female   DOB: [birth year], 20 y.o.   MRN: [id number]  Hospital Day: 7060  HPI: admitted on 09/25/15 d/t shortened cervix via US. BMZ injections started. Magnesium drip started for neuroprotection per protocol. Bedrest with bathroom privileges. +GC 09/26/15 Dr. Clearance CootsHarper notified patient.Course of Rocephin given. Maternal Tachycardia noticed. IVF bolus given. Most likely due to IDA. MFM consult completed on 09/28/15, see note. Cervical length 0.954mm on 09/28/15. Hematology consult on 10/03/15 for anemia. IVPB Feraheme ordered given 10/05/15 and 10/12/15.Repeat labs 10/20/15: HgB: 8.6, iron studies were normal. TOC performed 10/25/15. 2 hour OGTT done 5/5, normal, HGB 9.9. F/U US on 5/5 demonstrated cervical dilation with ?membranes towards the canal. Decision to keep patient admitted until at least 32 weeks and reevaluate her prognosis at that time; if stable d/c around 32 weeks. Patient aware of POC. Patient did take breastfeeding class on 11/15/15.   S: No complaints.  Ready to go home tomorrow.    O: Blood pressure 114/64, pulse 107, temperature 97.9 F (36.6 C), temperature source Oral, resp. rate 18, height 5' (1.524 m), weight 137 lb 4.8 oz (62.279 kg), last menstrual period 04/14/2015, SpO2 100 %.   QMV:HQIONGEXFHT:Baseline: 135 bpm, Variability: Good {> 6 bpm), Accelerations: Reactive and Decelerations: Absent Toco: None BMW:UXLKGMWNSVE:Dilation: 1 Effacement (%): 50 Cervical Position: Anterior Station: -2 Exam by:: Fionna Merriott cnm  A/P- 20 y.o. admitted with: threatened preterm labor, dynamic cervical changes, cervical shortening, chronic anemia  Present on Admission:  . Preterm labor  Pregnancy Complications:threatened preterm labor, dynamic cervical changes, cervical shortening, chronic anemia Preterm labor management: bedrest advised, pelvic rest advised and hosptial admission for 60 days Dating:  257w6d PNL Needed:  none FWB:  Good  PTL:  stable  Anticipate discharge  home in AM.   R.Willia Genrich CNM

## 2015-11-23 NOTE — Progress Notes (Signed)
Hematology brief follow up note  Chart and lab results reviewed, her Hb has been around 9.8g/dl lately. Pt is likely going to be discharged home tomorrow. If possible, please repeat ferritin and iron +TIBC before discharge. I will follow the result. I will let my scheduler call her to set up follow up appointment if iron level still low. She can call my office at [phone] if she has questions for me.   Malachy MoodFeng, Cathaleen Korol  11/23/2015

## 2015-11-24 MED ORDER — DOCUSATE SODIUM 100 MG PO CAPS: 100.0000 mg | ORAL_CAPSULE | Freq: Two times a day (BID) | ORAL | Status: DC

## 2015-11-24 MED ORDER — MAGNESIUM HYDROXIDE 400 MG/5ML PO SUSP: 30.0000 mL | Freq: Every day | ORAL | Status: DC | PRN

## 2015-11-24 MED ORDER — ALBUTEROL SULFATE (2.5 MG/3ML) 0.083% IN NEBU: 3.0000 mL | INHALATION_SOLUTION | RESPIRATORY_TRACT | Status: DC | PRN

## 2015-11-24 MED ORDER — COMPLETENATE 29-1 MG PO CHEW: 1.0000 | CHEWABLE_TABLET | Freq: Every day | ORAL | Status: DC

## 2015-11-24 MED ORDER — POLYETHYLENE GLYCOL 3350 17 G PO PACK: 17.0000 g | PACK | Freq: Every day | ORAL | Status: DC | PRN

## 2015-11-24 MED ORDER — PROMETHAZINE HCL 25 MG PO TABS: 25.0000 mg | ORAL_TABLET | Freq: Four times a day (QID) | ORAL | Status: DC | PRN

## 2015-11-24 MED ORDER — PROGESTERONE MICRONIZED 200 MG PO CAPS: 200.0000 mg | ORAL_CAPSULE | Freq: Every day | ORAL | Status: DC

## 2015-11-24 MED ORDER — ACETAMINOPHEN 325 MG PO TABS: 650.0000 mg | ORAL_TABLET | ORAL | Status: DC | PRN

## 2015-11-24 MED ORDER — POLYSACCHARIDE IRON COMPLEX 150 MG PO CAPS: 150.0000 mg | ORAL_CAPSULE | Freq: Two times a day (BID) | ORAL | Status: DC

## 2015-11-24 NOTE — Discharge Summary (Signed)
OB Discharge Summary     Patient Name: Catherine Golden DOB: Aug 03, 1995 MRN: [id number]  Date of admission: 09/25/2015 Delivering MD: This patient has no babies on file.  Date of discharge: 11/24/2015  Admitting diagnosis: 23WKS, SHORT CERVIX Intrauterine pregnancy: [redacted]w[redacted]d     Secondary diagnosis:  Active Problems:   Preterm labor   Anemia, iron deficiency  Additional problems: Cervical shortening, dynamic cervical changes, preterm cervical dilation     Discharge diagnosis: Stable cervical changes, threatened preterm labor                                                                                                Hospital course:  HPI: admitted on 09/25/15 d/t shortened cervix via [name]. BMZ injections started. Magnesium drip started for neuroprotection per protocol. Bedrest with bathroom privileges. +GC 09/26/15 Dr. Clearance Coots notified patient.Course of Rocephin given. Maternal Tachycardia noticed. IVF bolus given. Most likely due to IDA. MFM consult completed on 09/28/15, see note. Cervical length 0.34mm on 09/28/15. Hematology consult on 10/03/15 for anemia. IVPB Feraheme ordered given 10/05/15 and 10/12/15.Repeat labs 10/20/15: HgB: 8.6, iron studies were normal. TOC performed 10/25/15. 2 hour OGTT done 5/5, normal, HGB 9.9. F/U US on 5/5 demonstrated cervical dilation with ?membranes towards the canal. Decision to keep patient admitted until at least 32 weeks and reevaluate her prognosis at that time; if stable d/c around 32 weeks. Patient aware of POC. Patient did take breastfeeding class on 11/15/15.   Physical exam  Filed Vitals:   11/23/15 0820 11/23/15 1532 11/23/15 2020 11/23/15 2357  BP: 123/61 112/63 132/67 122/62  Pulse: 107 104 95 95  Temp: 98.2 F (36.8 C) 98.2 F (36.8 C) 98.2 F (36.8 C) 98 F (36.7 C)  TempSrc: Oral Oral Oral Oral  Resp: Height:      Weight:      SpO2:       General: alert, cooperative and no distress Lochia: None Uterine  Fundus: soft, gravid Incision: N/A, none DVT Evaluation: No evidence of DVT seen on physical exam. No cords or calf tenderness. No significant calf/ankle edema. Labs: Lab Results  Component Value Date   WBC 6.9 11/16/2015   HGB 9.8* 11/16/2015   HCT 29.2* 11/16/2015   MCV 80.9 11/16/2015   PLT 217 11/16/2015   CMP Latest Ref Rng 09/27/2015  Glucose 65 - 99 mg/dL 782(N)  BUN 6 - 20 mg/dL <5(A)  Creatinine 2.13 - 1.00 mg/dL 0.86(V)  Sodium 784 - 696 mmol/L 137  Potassium 3.5 - 5.1 mmol/L 3.5  Chloride 101 - 111 mmol/L 106  CO2 22 - 32 mmol/L 24  Calcium 8.9 - 10.3 mg/dL 2.9(B)  Total Protein 6.5 - 8.1 g/dL 6.8  Total Bilirubin 0.3 - 1.2 mg/dL 2.8(U)  Alkaline Phos 38 - 126 U/L 72  AST 15 - 41 U/L 22  ALT 14 - 54 U/L 12(L)    Discharge instruction: per After Visit Summary and "Baby and Me Booklet".  After visit meds:    Medication List    ASK your doctor about these medications  albuterol 108 (90 Base) MCG/ACT inhaler  Commonly known as:  PROVENTIL HFA;VENTOLIN HFA  Inhale 2 puffs into the lungs every 4 (four) hours as needed for wheezing.     azithromycin 250 MG tablet  Commonly known as:  ZITHROMAX  Take 4 tablets (1,000 mg total) by mouth once.     cefixime 400 MG tablet  Commonly known as:  SUPRAX  Take 1 tablet (400 mg total) by mouth daily.     FUSION PLUS Caps  Take 1 tablet by mouth daily.     metroNIDAZOLE 0.75 % vaginal gel  Commonly known as:  METROGEL VAGINAL  Place 1 Applicatorful vaginally at bedtime.     terconazole 0.4 % vaginal cream  Commonly known as:  TERAZOL 7  Place 1 applicator vaginally at bedtime.     VITAFOL FE+ 90-1-200 & 50 MG Cppk  Take 2 tablets by mouth daily.     VITAFOL ULTRA 29-0.6-0.4-200 MG Caps  Take 1 capsule by mouth daily.        Diet: Iron rich, routiene regular diet  Activity: Advance as tolerated. Pelvic rest for 6 weeks.   Outpatient follow up: 3 days Follow up Appt:No future appointments. Follow  up Visit:No Follow-up on file.    11/24/2015 Roe Coombsachelle A Mercer Stallworth, CNM

## 2015-11-24 NOTE — Discharge Instructions (Signed)
Pelvic Rest Pelvic rest is sometimes recommended for women when:   The placenta is partially or completely covering the opening of the cervix (placenta previa).  There is bleeding between the uterine wall and the amniotic sac in the first trimester (subchorionic hemorrhage).  The cervix begins to open without labor starting (incompetent cervix, cervical insufficiency).  The labor is too early (preterm labor). HOME CARE INSTRUCTIONS  Do not have sexual intercourse, stimulation, or an orgasm.  Do not use tampons, douche, or put anything in the vagina.  Do not lift anything over 10 pounds (4.5 kg).  Avoid strenuous activity or straining your pelvic muscles. SEEK MEDICAL CARE IF:  You have any vaginal bleeding during pregnancy. Treat this as a potential emergency.  You have cramping pain felt low in the stomach (stronger than menstrual cramps).  You notice vaginal discharge (watery, mucus, or bloody).  You have a low, dull backache.  There are regular contractions or uterine tightening. SEEK IMMEDIATE MEDICAL CARE IF: You have vaginal bleeding and have placenta previa.    This information is not intended to replace advice given to you by your health care provider. Make sure you discuss any questions you have with your health care provider.   Document Released: 10/05/2010 Document Revised: 09/02/2011 Document Reviewed: 12/12/2014 Elsevier Interactive Patient Education 2016 Else[employer] Inc[employer] Fetal Movement Counts Patient Name: __________________________________________________ Patient Due Date: ____________________ Performing a fetal movement count is highly recommended in high-risk pregnancies, but it is good for every pregnant woman to do. Your health care provider may ask you to start counting fetal movements at 28 weeks of the pregnancy. Fetal movements often increase:  After eating a full meal.  After physical activity.  After eating or drinking something sweet or  cold.  At rest. Pay attention to when you feel the baby is most active. This will help you notice a pattern of your baby's sleep and wake cycles and what factors contribute to an increase in fetal movement. It is important to perform a fetal movement count at the same time each day when your baby is normally most active.  HOW TO COUNT FETAL MOVEMENTS  Find a quiet and comfortable area to sit or lie down on your left side. Lying on your left side provides the best blood and oxygen circulation to your baby.  Write down the day and time on a sheet of paper or in a journal.  Start counting kicks, flutters, swishes, rolls, or jabs in a 2-hour period. You should feel at least 10 movements within 2 hours.  If you do not feel 10 movements in 2 hours, wait 2-3 hours and count again. Look for a change in the pattern or not enough counts in 2 hours. SEEK MEDICAL CARE IF:  You feel less than 10 counts in 2 hours, tried twice.  There is no movement in over an hour.  The pattern is changing or taking longer each day to reach 10 counts in 2 hours.  You feel the baby is not moving as he or she usually does. Date: ____________ Movements: ____________ Start time: ____________ Catherine Golden time: ____________  Date: ____________ Movements: ____________ Start time: ____________ Catherine Golden time: ____________ Date: ____________ Movements: ____________ Start time: ____________ Catherine Golden time: ____________ Date: ____________ Movements: ____________ Start time: ____________ Catherine Golden time: ____________ Date: ____________ Movements: ____________ Start time: ____________ Catherine Golden time: ____________ Date: ____________ Movements: ____________ Start time: ____________ Catherine Golden time: ____________ Date: ____________ Movements: ____________ Start time: ____________ Catherine Golden time: ____________ Date: ____________ Movements: ____________ Start  time: ____________ Catherine Golden time: ____________  Date: ____________ Movements: ____________ Start time:  ____________ Catherine Golden time: ____________ Date: ____________ Movements: ____________ Start time: ____________ Catherine Golden time: ____________ Date: ____________ Movements: ____________ Start time: ____________ Catherine Golden time: ____________ Date: ____________ Movements: ____________ Start time: ____________ Catherine Golden time: ____________ Date: ____________ Movements: ____________ Start time: ____________ Catherine Golden time: ____________ Date: ____________ Movements: ____________ Start time: ____________ Catherine Golden time: ____________ Date: ____________ Movements: ____________ Start time: ____________ Catherine Golden time: ____________  Date: ____________ Movements: ____________ Start time: ____________ Catherine Golden time: ____________ Date: ____________ Movements: ____________ Start time: ____________ Catherine Golden time: ____________ Date: ____________ Movements: ____________ Start time: ____________ Catherine Golden time: ____________ Date: ____________ Movements: ____________ Start time: ____________ Catherine Golden time: ____________ Date: ____________ Movements: ____________ Start time: ____________ Catherine Golden time: ____________ Date: ____________ Movements: ____________ Start time: ____________ Catherine Golden time: ____________ Date: ____________ Movements: ____________ Start time: ____________ Catherine Golden time: ____________  Date: ____________ Movements: ____________ Start time: ____________ Catherine Golden time: ____________ Date: ____________ Movements: ____________ Start time: ____________ Catherine Golden time: ____________ Date: ____________ Movements: ____________ Start time: ____________ Catherine Golden time: ____________ Date: ____________ Movements: ____________ Start time: ____________ Catherine Golden time: ____________ Date: ____________ Movements: ____________ Start time: ____________ Catherine Golden time: ____________ Date: ____________ Movements: ____________ Start time: ____________ Catherine Golden time: ____________ Date: ____________ Movements: ____________ Start time: ____________ Catherine Golden time: ____________  Date:  ____________ Movements: ____________ Start time: ____________ Catherine Golden time: ____________ Date: ____________ Movements: ____________ Start time: ____________ Catherine Golden time: ____________ Date: ____________ Movements: ____________ Start time: ____________ Catherine Golden time: ____________ Date: ____________ Movements: ____________ Start time: ____________ Catherine Golden time: ____________ Date: ____________ Movements: ____________ Start time: ____________ Catherine Golden time: ____________ Date: ____________ Movements: ____________ Start time: ____________ Catherine Golden time: ____________ Date: ____________ Movements: ____________ Start time: ____________ Catherine Golden time: ____________  Date: ____________ Movements: ____________ Start time: ____________ Catherine Golden time: ____________ Date: ____________ Movements: ____________ Start time: ____________ Catherine Golden time: ____________ Date: ____________ Movements: ____________ Start time: ____________ Catherine Golden time: ____________ Date: ____________ Movements: ____________ Start time: ____________ Catherine Golden time: ____________ Date: ____________ Movements: ____________ Start time: ____________ Catherine Golden time: ____________ Date: ____________ Movements: ____________ Start time: ____________ Catherine Golden time: ____________ Date: ____________ Movements: ____________ Start time: ____________ Catherine Golden time: ____________  Date: ____________ Movements: ____________ Start time: ____________ Catherine Golden time: ____________ Date: ____________ Movements: ____________ Start time: ____________ Catherine Golden time: ____________ Date: ____________ Movements: ____________ Start time: ____________ Catherine Golden time: ____________ Date: ____________ Movements: ____________ Start time: ____________ Catherine Golden time: ____________ Date: ____________ Movements: ____________ Start time: ____________ Catherine Golden time: ____________ Date: ____________ Movements: ____________ Start time: ____________ Catherine Golden time: ____________ Date: ____________ Movements: ____________ Start  time: ____________ Catherine Golden time: ____________  Date: ____________ Movements: ____________ Start time: ____________ Catherine Golden time: ____________ Date: ____________ Movements: ____________ Start time: ____________ Catherine Golden time: ____________ Date: ____________ Movements: ____________ Start time: ____________ Catherine Golden time: ____________ Date: ____________ Movements: ____________ Start time: ____________ Catherine Golden time: ____________ Date: ____________ Movements: ____________ Start time: ____________ Catherine Golden time: ____________ Date: ____________ Movements: ____________ Start time: ____________ Catherine Golden time: ____________   This information is not intended to replace advice given to you by your health care provider. Make sure you discuss any questions you have with your health care provider.   Document Released: 07/10/2006 Document Revised: 07/01/2014 Document Reviewed: 04/06/2012 Elsevier Interactive Patient Education 2016 Elsevier Inc.  Cervical Insufficiency Cervical insufficiency is when the cervix is weak and starts to open (dilate) and thin (efface) before the pregnancy is at term and without labor starting. This is also called incompetent cervix. It can happen in the second or third trimester when the fetus starts putting pressure on the cervix. Cervical insufficiency can  lead to a miscarriage, preterm premature rupture of the membranes (PPROM), or having the baby early (preterm birth).  RISK FACTORS You may be more likely to develop cervical insufficiency if:  You have a shorter cervix than normal.  Damage or injury occurred to your cervix from a past pregnancy or surgery.  You were born with a cervical defect.  You have had a procedure done on the cervix, such as cervical biopsy.  You have a history of cervical insufficiency.  You have a history of PPROM.  You have ended several past pregnancies through abortion.  You were exposed to the drug diethylstilbestrol (DES). SYMPTOMS Often times,  women do not have any symptoms. Other times, women may only have mild symptoms that often start between week 14 through 20. The symptoms may last several days or weeks. These symptoms include:  Light spotting or bleeding from the vagina.  Pelvic pressure.  A change in vaginal discharge, such as discharge that changes from clear, white, or light yellow to pink or tan.  Back pain.  Abdominal pain or cramping. DIAGNOSIS Cervical insufficiency cannot be diagnosed before you become pregnant. Once you are pregnant, your health care provider will ask about your medical history and if you have had any problems in past pregnancies. Tell your health care provider about any procedures performed on your cervix or if you have a history of miscarriages or cervical insufficiency. If your health care provider thinks you are at high risk for cervical insufficiency or show signs of cervical insufficiency, he or she may:  Perform a pelvic exam. This will check for:  The presence of the membranes (amniotic sac) coming out of the cervix.  Cervical abnormalities.  Cervical injuries.  The presence of contractions.  Perform an ultrasonography (commonly called ultrasound) to measure the length and thickness of the cervix. TREATMENT If you have been diagnosed with cervical insufficiency, your health care provider may recommend:  Limiting physical activity.  Bed rest at home or in the hospital.  Pelvic rest, which means no sexual intercourse or placing anything in the vagina.  Cerclage to sew the cervix closed and prevent it from opening too early. The stitches (sutures) are removed between weeks 36 and 38 to avoid problems during labor. Cerclage may be recommended during pregnancy if you have had a history of miscarriages or preterm births without a known cause. It may also be recommended if you have a short cervix that was identified by ultrasound or if your health care provider has found that your cervix  has dilated before 24 weeks of pregnancy. Limiting physical activity and bed rest may or may not help prevent a preterm birth. WHEN SHOULD YOU SEEK IMMEDIATE MEDICAL CARE?  Seek immediate medical care if you show any symptoms of cervical insufficiency. You will need to go to the hospital to get checked immediately.   This information is not intended to replace advice given to you by your health care provider. Make sure you discuss any questions you have with your health care provider.   Document Released: 06/10/2005 Document Revised: 07/01/2014 Document Reviewed: 08/17/2012 Elsevier Interactive Patient Education [employer]2016 Elsevier Inc.

## 2015-11-28 ENCOUNTER — Ambulatory Visit (INDEPENDENT_AMBULATORY_CARE_PROVIDER_SITE_OTHER): Payer: Medicaid Other | Admitting: Certified Nurse Midwife

## 2015-11-28 VITALS — BP 111/67 | HR 101 | Wt 137.0 lb

## 2015-11-28 DIAGNOSIS — O219 Vomiting of pregnancy, unspecified: Secondary | ICD-10-CM

## 2015-11-28 DIAGNOSIS — Z3403 Encounter for supervision of normal first pregnancy, third trimester: Secondary | ICD-10-CM

## 2015-11-28 LAB — POCT URINALYSIS DIPSTICK
BILIRUBIN UA: NEGATIVE
GLUCOSE UA: NEGATIVE
Ketones, UA: NEGATIVE
LEUKOCYTES UA: NEGATIVE
NITRITE UA: NEGATIVE
Protein, UA: NEGATIVE
RBC UA: NEGATIVE
Spec Grav, UA: 1.015
Urobilinogen, UA: NEGATIVE
pH, UA: 7

## 2015-11-28 MED ORDER — ONDANSETRON HCL 4 MG PO TABS
4.0000 mg | ORAL_TABLET | Freq: Every day | ORAL | Status: DC | PRN
Start: 2015-11-28 — End: 2016-01-20

## 2015-11-28 MED ORDER — PROMETHAZINE HCL 25 MG PO TABS: 25.0000 mg | ORAL_TABLET | Freq: Four times a day (QID) | ORAL | Status: DC | PRN

## 2015-11-28 NOTE — Progress Notes (Signed)
Subjective:    Catherine Golden is a 20 y.o. female being seen today for her obstetrical visit. She is at 772w4d gestation. Patient reports nausea, no bleeding, no contractions, no cramping and no leaking. Fetal movement: normal.  Discussed at length approved activities, mostly bedrest, discussed reasons to visit [facility]: contractions, cramping, leaking and bleeding.  Patient and mother verbalized understanding.    Problem List Items Addressed This Visit    None    Visit Diagnoses    Encounter for supervision of normal first pregnancy in third trimester    -  Primary    Relevant Orders    POCT urinalysis dipstick      Patient Active Problem List   Diagnosis Date Noted  . Anemia, iron deficiency   . Preterm labor 09/25/2015  . Vaginal irritation 11/29/2013  . Asthma, mild intermittent, well-controlled 09/07/2013   Objective:    BP 111/67 mmHg  Pulse 101  Wt 137 lb (62.143 kg)  LMP 04/14/2015 (Exact Date) FHT:  138 BPM  Uterine Size: size equals dates  Presentation: cephalic     Assessment:    Pregnancy @ 5072w4d weeks   Hx of shortened cervix and prolonged hospitalization  Chronic anemia: stable  Plan:     labs reviewed, problem list updated Consent signed. GBS planning TDAP offered  Rhogam given for RH negative Pediatrician: discussed. Infant feeding: plans to breastfeed. Maternity leave: discussed. Cigarette smoking: never smoked. Orders Placed This Encounter  Procedures  . POCT urinalysis dipstick   No orders of the defined types were placed in this encounter.   Follow up in 1 Week.

## 2015-11-28 NOTE — Progress Notes (Signed)
Pt is having numbness and tingling in hands.

## 2015-12-06 ENCOUNTER — Ambulatory Visit (INDEPENDENT_AMBULATORY_CARE_PROVIDER_SITE_OTHER): Payer: Medicaid Other | Admitting: Certified Nurse Midwife

## 2015-12-06 ENCOUNTER — Other Ambulatory Visit: Payer: Self-pay | Admitting: Hematology

## 2015-12-06 VITALS — BP 117/74 | HR 101 | Temp 98.6°F | Wt 140.0 lb

## 2015-12-06 DIAGNOSIS — B373 Candidiasis of vulva and vagina: Secondary | ICD-10-CM

## 2015-12-06 DIAGNOSIS — B3731 Acute candidiasis of vulva and vagina: Secondary | ICD-10-CM

## 2015-12-06 DIAGNOSIS — D509 Iron deficiency anemia, unspecified: Secondary | ICD-10-CM

## 2015-12-06 DIAGNOSIS — Z3403 Encounter for supervision of normal first pregnancy, third trimester: Secondary | ICD-10-CM

## 2015-12-06 LAB — POCT URINALYSIS DIPSTICK
Bilirubin, UA: NEGATIVE
Blood, UA: NEGATIVE
GLUCOSE UA: NEGATIVE
KETONES UA: NEGATIVE
Nitrite, UA: NEGATIVE
PROTEIN UA: NEGATIVE
Spec Grav, UA: 1.01
Urobilinogen, UA: NEGATIVE
pH, UA: 7

## 2015-12-06 MED ORDER — FLUCONAZOLE 100 MG PO TABS
100.0000 mg | ORAL_TABLET | Freq: Once | ORAL | Status: DC
Start: 2015-12-06 — End: 2015-12-20

## 2015-12-06 NOTE — Progress Notes (Signed)
I agree with note by NP Student Peri MarisAndrew Brake.  Was present for exam.  Performed sterile cervical exam:  1.5 cm dilated, previously 1 cm.  Soft, midline, -2 station.  Continue to monitor.  R.Lyndle Pang CNM

## 2015-12-06 NOTE — Progress Notes (Signed)
Subjective:    Catherine Golden is a 20 y.o. female being seen today for her obstetrical visit. She is at 2064w5d gestation. Patient reports carpal tunnel symptoms, no bleeding, occasional contractions and occasional green vaginal discharge.. Fetal movement: normal.  Problem List Items Addressed This Visit    None    Visit Diagnoses    Encounter for supervision of normal first pregnancy in third trimester    -  Primary    Relevant Orders    POCT urinalysis dipstick (Completed)    Yeast vaginitis        Relevant Medications    fluconazole (DIFLUCAN) 100 MG tablet      Patient Active Problem List   Diagnosis Date Noted  . Anemia, iron deficiency   . Preterm labor 09/25/2015  . Vaginal irritation 11/29/2013  . Asthma, mild intermittent, well-controlled 09/07/2013   Objective:    BP 117/74 mmHg  Pulse 101  Temp(Src) 98.6 F (37 C)  Wt 140 lb (63.504 kg)  LMP 04/14/2015 (Exact Date) FHT:  148 BPM  Uterine Size: size equals dates  Presentation: cephalic     Assessment:    Pregnancy @ 2864w5d weeks   1.5 cm dilated, slight changes from previous exam.  Plan:     labs reviewed, problem list updated Consent signed. GBS sent TDAP offered  Rhogam given for RH negative Pediatrician: discussed. Infant feeding: undecided. Maternity leave: discussed.  Orders Placed This Encounter  Procedures  . POCT urinalysis dipstick   Meds ordered this encounter  Medications  . fluconazole (DIFLUCAN) 100 MG tablet    Sig: Take 1 tablet (100 mg total) by mouth once. Repeat dose in 48-72 hour.    Dispense:  3 tablet    Refill:  0   Follow up in 1 Week.

## 2015-12-08 ENCOUNTER — Telehealth: Payer: Self-pay | Admitting: Hematology

## 2015-12-08 NOTE — Telephone Encounter (Signed)
Per staff message from [facility] schedule patient for hosp f/u within 2-4 weeks with labs a few days prior  - before delivery of her baby. Spoke with patient re 6/29 lab @ 11 am and hosp f/u 7/5 @ 11:45 am. Per patient dates are good as far as delivery. Patient given dates/times/location/phone number and contact. Demographic and insurance information confirmed.

## 2015-12-10 LAB — NUSWAB VG+, CANDIDA 6SP
CANDIDA ALBICANS, NAA: NEGATIVE
CANDIDA GLABRATA, NAA: NEGATIVE
CANDIDA LUSITANIAE, NAA: NEGATIVE
CANDIDA PARAPSILOSIS, NAA: NEGATIVE
CANDIDA TROPICALIS, NAA: NEGATIVE
Candida krusei, NAA: NEGATIVE
Chlamydia trachomatis, NAA: NEGATIVE
Neisseria gonorrhoeae, NAA: NEGATIVE
Trich vag by NAA: NEGATIVE

## 2015-12-14 ENCOUNTER — Ambulatory Visit (INDEPENDENT_AMBULATORY_CARE_PROVIDER_SITE_OTHER): Payer: Medicaid Other | Admitting: Certified Nurse Midwife

## 2015-12-14 VITALS — BP 119/69 | HR 92 | Wt 140.0 lb

## 2015-12-14 DIAGNOSIS — J4520 Mild intermittent asthma, uncomplicated: Secondary | ICD-10-CM

## 2015-12-14 DIAGNOSIS — Z3403 Encounter for supervision of normal first pregnancy, third trimester: Secondary | ICD-10-CM

## 2015-12-14 LAB — POCT URINALYSIS DIPSTICK
BILIRUBIN UA: NEGATIVE
GLUCOSE UA: NEGATIVE
KETONES UA: NEGATIVE
Leukocytes, UA: NEGATIVE
Nitrite, UA: NEGATIVE
Protein, UA: NEGATIVE
RBC UA: NEGATIVE
SPEC GRAV UA: 1.01
Urobilinogen, UA: NEGATIVE
pH, UA: 8

## 2015-12-14 MED ORDER — ALBUTEROL SULFATE HFA 108 (90 BASE) MCG/ACT IN AERS: 2.0000 | INHALATION_SPRAY | RESPIRATORY_TRACT | Status: DC | PRN

## 2015-12-14 NOTE — Progress Notes (Signed)
Subjective:    Catherine Golden is a 20 y.o. female being seen today for her obstetrical visit. She is at 5023w6d gestation. Patient reports fatigue and occasional contractions. Fetal movement: normal.  Problem List Items Addressed This Visit      Respiratory   Asthma, mild intermittent, well-controlled   Relevant Medications   albuterol (PROVENTIL HFA;VENTOLIN HFA) 108 (90 Base) MCG/ACT inhaler    Other Visit Diagnoses    Encounter for supervision of normal first pregnancy in third trimester    -  Primary    Relevant Orders    POCT urinalysis dipstick (Completed)      Patient Active Problem List   Diagnosis Date Noted  . Anemia, iron deficiency   . Preterm labor 09/25/2015  . Vaginal irritation 11/29/2013  . Asthma, mild intermittent, well-controlled 09/07/2013   Objective:    BP 119/69 mmHg  Pulse 92  Wt 140 lb (63.504 kg)  LMP 04/14/2015 (Exact Date) FHT:  138 BPM  Uterine Size: size equals dates  Presentation: cephalic    2 cm dilated Assessment:    Pregnancy @ 5223w6d weeks   Plan:   Education provided regarding activity limit and when to report to [facility].    labs reviewed, problem list updated Consent signed. GBS sent TDAP offered  Rhogam given for RH negative Pediatrician: discussed.  Orders Placed This Encounter  Procedures  . POCT urinalysis dipstick   Meds ordered this encounter  Medications  . albuterol (PROVENTIL HFA;VENTOLIN HFA) 108 (90 Base) MCG/ACT inhaler    Sig: Inhale 2 puffs into the lungs every 4 (four) hours as needed for wheezing.    Dispense:  2 Inhaler    Refill:  11   Follow up in 1 Week.

## 2015-12-15 NOTE — Progress Notes (Signed)
I agree with note by NP Student Peri MarisAndrew Brake.  Was present for exam and performed cervical check.  R.Ivar Domangue CNM

## 2015-12-16 LAB — STREP GP B NAA: STREP GROUP B AG: POSITIVE — AB

## 2015-12-19 ENCOUNTER — Other Ambulatory Visit: Payer: Self-pay | Admitting: Certified Nurse Midwife

## 2015-12-19 LAB — OB RESULTS CONSOLE GBS: GBS: POSITIVE

## 2015-12-20 ENCOUNTER — Ambulatory Visit (INDEPENDENT_AMBULATORY_CARE_PROVIDER_SITE_OTHER): Payer: Medicaid Other | Admitting: Certified Nurse Midwife

## 2015-12-20 VITALS — BP 124/73 | HR 97 | Wt 142.0 lb

## 2015-12-20 DIAGNOSIS — O0993 Supervision of high risk pregnancy, unspecified, third trimester: Secondary | ICD-10-CM

## 2015-12-20 LAB — POCT URINALYSIS DIPSTICK
Bilirubin, UA: NEGATIVE
Blood, UA: NEGATIVE
Glucose, UA: NEGATIVE
Ketones, UA: NEGATIVE
NITRITE UA: NEGATIVE
Spec Grav, UA: 1.015
UROBILINOGEN UA: NEGATIVE
pH, UA: 7

## 2015-12-20 NOTE — Addendum Note (Signed)
Addended by: Luella CookMCINTYRE, DIRECE E on: 12/20/2015 10:23 AM   Modules accepted: Orders

## 2015-12-20 NOTE — Progress Notes (Signed)
Subjective:    Catherine Golden is a 20 y.o. female being seen today for her obstetrical visit. She is at 7079w5d gestation. Patient reports backache, no bleeding, no leaking and occasional contractions. Reports a few contractions a day, is having frequent urination at night.  Is having round ligament pain.  Fetal movement: normal.  Problem List Items Addressed This Visit    None     Patient Active Problem List   Diagnosis Date Noted  . Anemia, iron deficiency   . Preterm labor 09/25/2015  . Vaginal irritation 11/29/2013  . Asthma, mild intermittent, well-controlled 09/07/2013   Objective:    BP 124/73 mmHg  Pulse 97  Wt 142 lb (64.411 kg)  LMP 04/14/2015 (Exact Date) FHT:  140 BPM  Uterine Size: size equals dates  Presentation: cephalic     Assessment:    Pregnancy @ 3879w5d weeks   Short cervix complicating pregnancy: on vaginal progesterone  Round ligament pain   Plan:     labs reviewed, problem list updated Consent signed. GBS results reviewed TDAP offered  Rhogam given for RH negative Pediatrician: discussed. Infant feeding: plans to breastfeed. Maternity leave: N/A. Cigarette smoking: never smoked. No orders of the defined types were placed in this encounter.   No orders of the defined types were placed in this encounter.   Follow up in 1 Week.

## 2015-12-21 ENCOUNTER — Other Ambulatory Visit (HOSPITAL_BASED_OUTPATIENT_CLINIC_OR_DEPARTMENT_OTHER): Payer: Medicaid Other

## 2015-12-21 DIAGNOSIS — D509 Iron deficiency anemia, unspecified: Secondary | ICD-10-CM | POA: Diagnosis present

## 2015-12-21 LAB — CBC & DIFF AND RETIC
BASO%: 0.3 % (ref 0.0–2.0)
BASOS ABS: 0 10*3/uL (ref 0.0–0.1)
EOS%: 3.3 % (ref 0.0–7.0)
Eosinophils Absolute: 0.2 10*3/uL (ref 0.0–0.5)
HEMATOCRIT: 31.8 % — AB (ref 34.8–46.6)
HEMOGLOBIN: 10.9 g/dL — AB (ref 11.6–15.9)
IMMATURE RETIC FRACT: 15.6 % — AB (ref 1.60–10.00)
LYMPH#: 1.7 10*3/uL (ref 0.9–3.3)
LYMPH%: 24 % (ref 14.0–49.7)
MCH: 27.5 pg (ref 25.1–34.0)
MCHC: 34.3 g/dL (ref 31.5–36.0)
MCV: 80.3 fL (ref 79.5–101.0)
MONO#: 0.5 10*3/uL (ref 0.1–0.9)
MONO%: 6.8 % (ref 0.0–14.0)
NEUT#: 4.6 10*3/uL (ref 1.5–6.5)
NEUT%: 65.6 % (ref 38.4–76.8)
PLATELETS: 211 10*3/uL (ref 145–400)
RBC: 3.96 10*6/uL (ref 3.70–5.45)
RDW: 16.9 % — ABNORMAL HIGH (ref 11.2–14.5)
RETIC %: 1.35 % (ref 0.70–2.10)
RETIC CT ABS: 53.46 10*3/uL (ref 33.70–90.70)
WBC: 6.9 10*3/uL (ref 3.9–10.3)
nRBC: 0 % (ref 0–0)

## 2015-12-21 LAB — IRON AND TIBC
%SAT: 10 % — AB (ref 21–57)
IRON: 44 ug/dL (ref 41–142)
TIBC: 448 ug/dL — ABNORMAL HIGH (ref 236–444)
UIBC: 403 ug/dL — AB (ref 120–384)

## 2015-12-21 LAB — TECHNOLOGIST REVIEW

## 2015-12-21 LAB — FERRITIN: Ferritin: 14 ng/ml (ref 9–269)

## 2015-12-27 ENCOUNTER — Encounter: Payer: Self-pay | Admitting: Hematology

## 2015-12-27 ENCOUNTER — Encounter: Payer: Medicaid Other | Admitting: Hematology

## 2015-12-27 NOTE — Progress Notes (Signed)
No show today   This encounter was created in error - please disregard. 

## 2015-12-28 ENCOUNTER — Telehealth: Payer: Self-pay | Admitting: Hematology

## 2015-12-28 NOTE — Telephone Encounter (Signed)
lvm for pt to call to r/s missed appt

## 2015-12-29 ENCOUNTER — Encounter: Payer: Medicaid Other | Admitting: Certified Nurse Midwife

## 2016-01-02 ENCOUNTER — Ambulatory Visit (INDEPENDENT_AMBULATORY_CARE_PROVIDER_SITE_OTHER): Payer: Medicaid Other | Admitting: Certified Nurse Midwife

## 2016-01-02 VITALS — BP 130/71 | HR 91 | Temp 98.8°F | Wt 144.2 lb

## 2016-01-02 DIAGNOSIS — Z3403 Encounter for supervision of normal first pregnancy, third trimester: Secondary | ICD-10-CM

## 2016-01-02 LAB — POCT URINALYSIS DIPSTICK
BILIRUBIN UA: NEGATIVE
Blood, UA: NEGATIVE
GLUCOSE UA: NEGATIVE
KETONES UA: NEGATIVE
Nitrite, UA: NEGATIVE
PROTEIN UA: NEGATIVE
Urobilinogen, UA: NEGATIVE
pH, UA: 7

## 2016-01-02 NOTE — Progress Notes (Signed)
I agree with note by NP Student Peri MarisAndrew Brake.  Was present for exam and performed the vaginal exam.  R.Kayden Amend CNM

## 2016-01-02 NOTE — Progress Notes (Signed)
Subjective:    Catherine Golden is a 20 y.o. female being seen today for her obstetrical visit. She is at 478w4d gestation. Patient reports no bleeding, no contractions, no leaking and occasional cramping, pelvic bone, occasional leg spasms, possible mucus plug discharge on 12-30-2015.  Reports having to use her albuterol inhaler a couple of times per day. Fetal movement: normal.  Problem List Items Addressed This Visit    None    Visit Diagnoses    Encounter for supervision of normal first pregnancy in third trimester    -  Primary    Relevant Orders    POCT urinalysis dipstick      Patient Active Problem List   Diagnosis Date Noted  . Anemia, iron deficiency   . Preterm labor 09/25/2015  . Vaginal irritation 11/29/2013  . Asthma, mild intermittent, well-controlled 09/07/2013    Objective:    BP 130/71 mmHg  Pulse 91  Temp(Src) 98.8 F (37.1 C)  Wt 144 lb 3.2 oz (65.409 kg)  LMP 04/14/2015 (Exact Date) FHT: 140 BPM  Uterine Size: size equals dates  Presentations: cephalic  Pelvic Exam:              Dilation: 3.5 cm       Effacement: 60%             Station:  -2    Consistency: soft            Position: middle     Assessment:    Pregnancy @ 6478w4d weeks   Plan:   Plans for delivery: Vaginal anticipated; labs reviewed; problem list updated Counseling: Consent signed. Infant feeding: plans to breastfeed. Cigarette smoking: never smoked. L&D discussion: symptoms of labor, discussed when to call, discussed what number to call, anesthetic/analgesic options reviewed. Postpartum supports and preparation: circumcision discussed and contraception plans discussed.  Follow up in 1 Week.

## 2016-01-02 NOTE — Progress Notes (Signed)
Patient reports she is doing well- she has lost her mucus plug.

## 2016-01-04 ENCOUNTER — Telehealth: Payer: Self-pay | Admitting: *Deleted

## 2016-01-04 NOTE — Telephone Encounter (Signed)
LM for rtn call to discuss lab results.  Notes Recorded by Malachy MoodYan Feng, MD on 01/02/2016 at 8:47 AM   Please let pt know the last lab test result, she did not show for her appointment. Let her know that her iron deficiency and anemia has improved, but not completely resolved, if she is willing to come here, will set up her appointment to see me and have one more iv feraheme before her delivery.   Thanks  Malachy MoodFeng, Yan  01/02/2016

## 2016-01-09 ENCOUNTER — Ambulatory Visit (INDEPENDENT_AMBULATORY_CARE_PROVIDER_SITE_OTHER): Payer: Medicaid Other | Admitting: Certified Nurse Midwife

## 2016-01-09 VITALS — BP 116/73 | HR 91 | Temp 98.2°F | Wt 144.0 lb

## 2016-01-09 DIAGNOSIS — Z3403 Encounter for supervision of normal first pregnancy, third trimester: Secondary | ICD-10-CM

## 2016-01-09 LAB — POCT URINALYSIS DIPSTICK
BILIRUBIN UA: NEGATIVE
GLUCOSE UA: NEGATIVE
Ketones, UA: NEGATIVE
NITRITE UA: NEGATIVE
PH UA: 7
RBC UA: NEGATIVE
SPEC GRAV UA: 1.015
UROBILINOGEN UA: NEGATIVE

## 2016-01-09 NOTE — Progress Notes (Signed)
I agree with note by NP Student Andrew Brake.  Was present for exam.  R.Jakeel Starliper CNM 

## 2016-01-09 NOTE — Progress Notes (Signed)
Patient was feeling ill last night. She is getting a lot of reflux. She ate so much she got sick in the middle of the night.

## 2016-01-09 NOTE — Progress Notes (Signed)
Subjective:    Catherine Golden is a 20 y.o. female being seen today for her obstetrical visit. She is at 8370w4d gestation. Patient reports fatigue, nausea, no bleeding, no contractions, no cramping and no leaking. Fetal movement: normal.  Problem List Items Addressed This Visit    None     Patient Active Problem List   Diagnosis Date Noted  . Anemia, iron deficiency   . Preterm labor 09/25/2015  . Vaginal irritation 11/29/2013  . Asthma, mild intermittent, well-controlled 09/07/2013    Objective:    LMP 04/14/2015 (Exact Date) FHT: 150 BPM  Uterine Size: size equals dates  Presentations: cephalic  Pelvic Exam: declined     Assessment:    Pregnancy @ 7570w4d weeks   Plan:   Plans for delivery: Vaginal anticipated; labs reviewed; problem list updated Counseling: Consent signed. Infant feeding: plans to breastfeed. Cigarette smoking: never smoked. L&D discussion: symptoms of labor, discussed when to call, discussed what number to call, anesthetic/analgesic options reviewed and delivering clinician:  plans no preference. Postpartum supports and preparation: circumcision discussed and contraception plans discussed.  Follow up in 1 Week.

## 2016-01-10 ENCOUNTER — Telehealth: Payer: Self-pay | Admitting: *Deleted

## 2016-01-10 ENCOUNTER — Other Ambulatory Visit: Payer: Self-pay | Admitting: *Deleted

## 2016-01-10 NOTE — Telephone Encounter (Signed)
Pt called and left message wanting to know if Dr. Mosetta PuttFeng still wanted pt to receive Feraheme infusion before pt goes in for delivery.   Spoke with pt and was informed that she will go in for delivery on  01/19/16. Noted that pt was a NO SHOW for appt on 12/27/15.  Pt stated she did not have transportation on that date.   Gave pt appts for lab and  Hospital f/u with Dr. Mosetta PuttFeng on  01/11/16.  Pt voiced understanding. POF sent to scheduler. Pt's   Phone     [phone].

## 2016-01-11 ENCOUNTER — Ambulatory Visit (HOSPITAL_BASED_OUTPATIENT_CLINIC_OR_DEPARTMENT_OTHER): Payer: Medicaid Other | Admitting: Hematology

## 2016-01-11 ENCOUNTER — Telehealth: Payer: Self-pay | Admitting: Hematology

## 2016-01-11 ENCOUNTER — Encounter: Payer: Self-pay | Admitting: Hematology

## 2016-01-11 ENCOUNTER — Other Ambulatory Visit (HOSPITAL_BASED_OUTPATIENT_CLINIC_OR_DEPARTMENT_OTHER): Payer: Medicaid Other

## 2016-01-11 VITALS — BP 108/51 | HR 83 | Temp 98.2°F | Resp 18 | Ht 60.0 in | Wt 143.9 lb

## 2016-01-11 DIAGNOSIS — O99013 Anemia complicating pregnancy, third trimester: Secondary | ICD-10-CM | POA: Diagnosis not present

## 2016-01-11 DIAGNOSIS — D509 Iron deficiency anemia, unspecified: Secondary | ICD-10-CM

## 2016-01-11 LAB — CBC & DIFF AND RETIC
BASO%: 0.5 % (ref 0.0–2.0)
Basophils Absolute: 0 10*3/uL (ref 0.0–0.1)
EOS ABS: 0.2 10*3/uL (ref 0.0–0.5)
EOS%: 3.6 % (ref 0.0–7.0)
HCT: 34.4 % — ABNORMAL LOW (ref 34.8–46.6)
HGB: 11.9 g/dL (ref 11.6–15.9)
Immature Retic Fract: 18 % — ABNORMAL HIGH (ref 1.60–10.00)
LYMPH%: 30.2 % (ref 14.0–49.7)
MCH: 27.5 pg (ref 25.1–34.0)
MCHC: 34.6 g/dL (ref 31.5–36.0)
MCV: 79.4 fL — ABNORMAL LOW (ref 79.5–101.0)
MONO#: 0.6 10*3/uL (ref 0.1–0.9)
MONO%: 10.1 % (ref 0.0–14.0)
NEUT%: 55.6 % (ref 38.4–76.8)
NEUTROS ABS: 3.1 10*3/uL (ref 1.5–6.5)
Platelets: 228 10*3/uL (ref 145–400)
RBC: 4.33 10*6/uL (ref 3.70–5.45)
RDW: 15.3 % — AB (ref 11.2–14.5)
RETIC %: 1.52 % (ref 0.70–2.10)
Retic Ct Abs: 65.82 10*3/uL (ref 33.70–90.70)
WBC: 5.6 10*3/uL (ref 3.9–10.3)
lymph#: 1.7 10*3/uL (ref 0.9–3.3)

## 2016-01-11 LAB — IRON AND TIBC
%SAT: 11 % — ABNORMAL LOW (ref 21–57)
IRON: 52 ug/dL (ref 41–142)
TIBC: 488 ug/dL — ABNORMAL HIGH (ref 236–444)
UIBC: 436 ug/dL — ABNORMAL HIGH (ref 120–384)

## 2016-01-11 LAB — FERRITIN: FERRITIN: 13 ng/mL (ref 9–269)

## 2016-01-11 NOTE — Progress Notes (Signed)
[facility]  Telephone:(336) [phone] Fax:(336) [phone]  Clinic Follow up Note   Patient Care Team: Cherece Griffith CitronNicole Grier, MD as PCP - General (Pediatrics) 01/11/2016  CHIEF COMPLAINT: Follow-up iron deficient anemia  History of present illness (10/03/2015) [name] is a 20 year old female without significant past medical history, with 24w pregnancy, was recently admitted to almost hospital for preterm cervical change. She was noticed to have worsening anemia, and hematology consult was called.  This is a her first pregnancy. She denies past medical history of anemia. She did have heavy period, especially on the 2 and 3 she uses extra large tampon, your hemoglobin was around 11 in 3 months ago. She has been on oral partners such a right one tablet daily and multivitamins. She was admitted 8 days ago, hemoglobin was 8.1, was hematocrit 24.6, MCV 76.6, and her her CBC 4-6 days ago showed hemoglobin 6.8-7.0. She denies any signs of bleeding, no hematochezia or melena. She feels well overall, denies any dyspnea or chest discomfort with exertion. She is at bedrest, able to use bathroom on her own.   CURRENT THERAPY: IV FERAHEME 510mg  as needed, oral ferrous sulfate 1-2 tablets a day  INTERVAL HISTORY: Catherine Golden returns for follow-up. [facility] 2017 for her iron deficient anemia. She received IV Feraheme twice in the hospital and tolerate well. She has been taking oral iron since then also, but not very compliant. She states she eats iron-enriched food. Her pregnancy is going well, she is due next week. She has a mild fatigue, able to function very well. No other complaints.  REVIEW OF SYSTEMS:   Constitutional: Denies fevers, chills or abnormal weight loss Eyes: Denies blurriness of vision Ears, nose, mouth, throat, and face: Denies mucositis or sore throat Respiratory: Denies cough, dyspnea or wheezes Cardiovascular: Denies palpitation,  chest discomfort or lower extremity swelling Gastrointestinal:  Denies nausea, heartburn or change in bowel habits Skin: Denies abnormal skin rashes Lymphatics: Denies new lymphadenopathy or easy bruising Neurological:Denies numbness, tingling or new weaknesses Behavioral/Psych: Mood is stable, no new changes  All other systems were reviewed with the patient and are negative.  MEDICAL HISTORY:  Past Medical History  Diagnosis Date  . Asthma     SURGICAL HISTORY: Past Surgical History  Procedure Laterality Date  . Wisdom tooth extraction      I have reviewed the social history and family history with the patient and they are unchanged from previous note.  ALLERGIES:  has No Known Allergies.  MEDICATIONS:  Current Outpatient Prescriptions  Medication Sig Dispense Refill  . acetaminophen (TYLENOL) 325 MG tablet Take 2 tablets (650 mg total) by mouth every 4 (four) hours as needed for mild pain (OR temperature greater than 100.5 F). 500 tablet 1  . albuterol (PROVENTIL HFA;VENTOLIN HFA) 108 (90 Base) MCG/ACT inhaler Inhale 2 puffs into the lungs every 4 (four) hours as needed for wheezing. 2 Inhaler 11  . ondansetron (ZOFRAN) 4 MG tablet Take 1 tablet (4 mg total) by mouth daily as needed. (Patient not taking: Reported on 01/09/2016) 30 tablet 1  . Prenat-Fe Poly-Methfol-FA-DHA (VITAFOL ULTRA) 29-0.6-0.4-200 MG CAPS Take by mouth.    . promethazine (PHENERGAN) 25 MG tablet Take 1 tablet (25 mg total) by mouth every 6 (six) hours as needed for nausea or vomiting. (Patient not taking: Reported on 01/09/2016) 30 tablet 2   No current facility-administered medications for this visit.    PHYSICAL EXAMINATION: ECOG PERFORMANCE STATUS: 1 -  Symptomatic but completely ambulatory  Filed Vitals:   01/11/16 1037  BP: 108/51  Pulse: 83  Temp: 98.2 F (36.8 C)  Resp: 18   Filed Weights   01/11/16 1037  Weight: 143 lb 14.4 oz (65.273 kg)    GENERAL:alert, no distress and  comfortable SKIN: skin color, texture, turgor are normal, no rashes or significant lesions EYES: normal, Conjunctiva are pink and non-injected, sclera clear OROPHARYNX:no exudate, no erythema and lips, buccal mucosa, and tongue normal  NECK: supple, thyroid normal size, non-tender, without nodularity LYMPH:  no palpable lymphadenopathy in the cervical, axillary or inguinal LUNGS: clear to auscultation and percussion with normal breathing effort HEART: regular rate & rhythm and no murmurs and no lower extremity edema ABDOMEN:abdomen distended due to pregnancy, soft, non-tender and normal bowel sounds Musculoskeletal:no cyanosis of digits and no clubbing  NEURO: alert & oriented x 3 with fluent speech, no focal motor/sensory deficits  LABORATORY DATA:  I have reviewed the data as listed CBC Latest Ref Rng 01/11/2016 12/21/2015 11/16/2015  WBC 3.9 - 10.3 10e3/uL 5.6 6.9 6.9  Hemoglobin 11.6 - 15.9 g/dL 16.1 10.9(L) 9.8(L)  Hematocrit 34.8 - 46.6 % 34.4(L) 31.8(L) 29.2(L)  Platelets 145 - 400 10e3/uL 228 211 217    CMP Latest Ref Rng 09/27/2015  Glucose 65 - 99 mg/dL 096(E)  BUN 6 - 20 mg/dL <4(V)  Creatinine 4.09 - 1.00 mg/dL 8.11(B)  Sodium 147 - 829 mmol/L 137  Potassium 3.5 - 5.1 mmol/L 3.5  Chloride 101 - 111 mmol/L 106  CO2 22 - 32 mmol/L 24  Calcium 8.9 - 10.3 mg/dL 5.6(O)  Total Protein 6.5 - 8.1 g/dL 6.8  Total Bilirubin 0.3 - 1.2 mg/dL 1.3(Y)  Alkaline Phos 38 - 126 U/L 72  AST 15 - 41 U/L 22  ALT 14 - 54 U/L 12(L)   Results for Catherine Golden, Catherine Golden (MRN [id number]) as of 01/11/2016 14:06  Ref. Range 10/20/2015 08:52 12/21/2015 11:18 01/11/2016 10:14  Iron Latest Ref Range: 41-142 ug/dL 295 44 52  UIBC Latest Ref Range: 120-384 ug/dL 284 132 (H) 440 (H)  TIBC Latest Ref Range: 236-444 ug/dL 102 725 (H) 366 (H)  %SAT Latest Ref Range: 21-57 %  10 (L) 11 (L)  Saturation Ratios Latest Ref Range: 10.4-31.8 % 29    Ferritin Latest Ref Range: 9-269 ng/ml 421 (H) 14 13       RADIOGRAPHIC STUDIES: I have personally reviewed the radiological images as listed and agreed with the findings in the report. No results found.   ASSESSMENT & PLAN:  20 yo female w/o significant past medical history, currently pregnant, presented with worsening anemia   1. Iron deficienct anemia  -She has microcytic anemia, initial iron studies showed ferritin 5, low transferrin saturation 7%, significant elevated TIBC, this is all consistent with iron deficient anemia, likely secondary to her pregnancy. She did have menorrhagia before pregnancy. -she has no clinical signs of bleeding, stool OB was negative  -She responded to IV Feraheme very well, her anemia has significantly improved. Her hemoglobin 11.9 today -She still has iron deficiency, she is not compliant with oral iron pill, I encouraged her to restart ferrous sulfate 1-2 tablets a day, with stool softener for constipation. -She is due for delivery next week, I think her anemia is nearly resolved, we'll hold on IV Feraheme for now. -I'll see her back one month after her delivery and repeat her iron study. If still low, I'll set up IV Feraheme at that time.  Plan -  She will restart oral iron pill -She is due for delivery next week -I'll see her back in 7-8 weeks, with lab 1 week before.  All questions were answered. The patient knows to call the clinic with any problems, questions or concerns. No barriers to learning was detected. I spent 20 minutes counseling the patient face to face. The total time spent in the appointment was 25 minutes and more than 50% was on counseling and review of test results     Malachy Mood, MD 01/11/2016

## 2016-01-11 NOTE — Telephone Encounter (Signed)
Gave pt cal & avs °

## 2016-01-12 ENCOUNTER — Ambulatory Visit: Payer: Medicaid Other

## 2016-01-16 ENCOUNTER — Ambulatory Visit (INDEPENDENT_AMBULATORY_CARE_PROVIDER_SITE_OTHER): Payer: Medicaid Other | Admitting: Obstetrics and Gynecology

## 2016-01-16 DIAGNOSIS — Z3483 Encounter for supervision of other normal pregnancy, third trimester: Secondary | ICD-10-CM | POA: Diagnosis not present

## 2016-01-16 DIAGNOSIS — Z3493 Encounter for supervision of normal pregnancy, unspecified, third trimester: Secondary | ICD-10-CM | POA: Insufficient documentation

## 2016-01-16 DIAGNOSIS — O99820 Streptococcus B carrier state complicating pregnancy: Secondary | ICD-10-CM | POA: Insufficient documentation

## 2016-01-16 NOTE — Progress Notes (Signed)
Prenatal Visit Note Date: 01/16/2016 Clinic: [facility]  Subjective:  Catherine Golden is a 20 y.o. G1P0000 at [redacted]w[redacted]d being seen today for ongoing prenatal care.  She is currently monitored for the following issues for this low-risk pregnancy and has Asthma, mild intermittent, well-controlled; Anemia, iron deficiency; Supervision of normal pregnancy in third trimester; and GBS (group B Streptococcus carrier), +RV culture, currently pregnant on her problem list.  Patient reports no complaints.   Contractions: Irritability. Vag. Bleeding: None.  Movement: Present. Denies leaking of fluid.   The following portions of the patient's history were reviewed and updated as appropriate: allergies, current medications, past family history, past medical history, past social history, past surgical history and problem list. Problem list updated.  Objective:   Vitals:   01/16/16 1120  BP: 120/73  Pulse: 88  Temp: 98.1 F (36.7 C)  Weight: 144 lb (65.3 kg)    Fetal Status: Fetal Heart Rate (bpm): 136   Movement: Present     General:  Alert, oriented and cooperative. Patient is in no acute distress.  Skin: Skin is warm and dry. No rash noted.   Cardiovascular: Normal heart rate noted  Respiratory: Normal respiratory effort, no problems with respiration noted  Abdomen: Soft, gravid, appropriate for gestational age. Pain/Pressure: Present     Pelvic:  3-4/75/-1/cephalic/midposition        Extremities: Normal range of motion.  Edema: None  Mental Status: Normal mood and affect. Normal behavior. Normal judgment and thought content.   Urinalysis: Urine Protein: Negative Urine Glucose: Negative  Assessment and Plan:  Pregnancy: G1P0000 at [redacted]w[redacted]d  1. Supervision of normal pregnancy in third trimester Routine care. BC options d/w pt. Desires to breastfeed. Lives 15-38m from hospital labor precautions given. Set up PDIOL nv, d/w pt  2. GBS (group B Streptococcus carrier), +RV culture, currently  pregnant Pt aware  3. Anemia S/p recent IV iron infusion CBC Latest Ref Rng & Units 01/11/2016 12/21/2015 11/16/2015  WBC 3.9 - 10.3 10e3/uL 5.6 6.9 6.9  Hemoglobin 11.6 - 15.9 g/dL 24.0 10.9(L) 9.8(L)  Hematocrit 34.8 - 46.6 % 34.4(L) 31.8(L) 29.2(L)  Platelets 145 - 400 10e3/uL 228 211 217     Term labor symptoms and general obstetric precautions including but not limited to vaginal bleeding, contractions, leaking of fluid and fetal movement were reviewed in detail with the patient. Please refer to After Visit Summary for other counseling recommendations.  Return in about 4 days (around 01/20/2016).   [place] Bing, MD

## 2016-01-17 ENCOUNTER — Encounter: Payer: Self-pay | Admitting: Pediatrics

## 2016-01-18 ENCOUNTER — Encounter: Payer: Self-pay | Admitting: Pediatrics

## 2016-01-20 ENCOUNTER — Inpatient Hospital Stay (HOSPITAL_COMMUNITY)
Admission: AD | Admit: 2016-01-20 | Discharge: 2016-01-22 | DRG: 775 | Disposition: A | Discharge status: Home / Self Care | Payer: Medicaid Other | Source: Ambulatory Visit | Attending: Obstetrics & Gynecology | Admitting: Obstetrics & Gynecology

## 2016-01-20 ENCOUNTER — Encounter (HOSPITAL_COMMUNITY): Payer: Self-pay | Admitting: Obstetrics & Gynecology

## 2016-01-20 DIAGNOSIS — Z7722 Contact with and (suspected) exposure to environmental tobacco smoke (acute) (chronic): Secondary | ICD-10-CM | POA: Diagnosis present

## 2016-01-20 DIAGNOSIS — O26873 Cervical shortening, third trimester: Secondary | ICD-10-CM | POA: Diagnosis present

## 2016-01-20 DIAGNOSIS — Z8249 Family history of ischemic heart disease and other diseases of the circulatory system: Secondary | ICD-10-CM | POA: Diagnosis not present

## 2016-01-20 DIAGNOSIS — O9952 Diseases of the respiratory system complicating childbirth: Secondary | ICD-10-CM | POA: Diagnosis present

## 2016-01-20 DIAGNOSIS — O26872 Cervical shortening, second trimester: Secondary | ICD-10-CM | POA: Insufficient documentation

## 2016-01-20 DIAGNOSIS — Z3A40 40 weeks gestation of pregnancy: Secondary | ICD-10-CM

## 2016-01-20 DIAGNOSIS — J4520 Mild intermittent asthma, uncomplicated: Secondary | ICD-10-CM | POA: Diagnosis present

## 2016-01-20 DIAGNOSIS — O99820 Streptococcus B carrier state complicating pregnancy: Secondary | ICD-10-CM

## 2016-01-20 DIAGNOSIS — Z833 Family history of diabetes mellitus: Secondary | ICD-10-CM | POA: Diagnosis not present

## 2016-01-20 DIAGNOSIS — O99824 Streptococcus B carrier state complicating childbirth: Secondary | ICD-10-CM | POA: Diagnosis present

## 2016-01-20 DIAGNOSIS — IMO0001 Reserved for inherently not codable concepts without codable children: Secondary | ICD-10-CM

## 2016-01-20 DIAGNOSIS — O98212 Gonorrhea complicating pregnancy, second trimester: Secondary | ICD-10-CM | POA: Insufficient documentation

## 2016-01-20 DIAGNOSIS — O9902 Anemia complicating childbirth: Secondary | ICD-10-CM | POA: Diagnosis present

## 2016-01-20 DIAGNOSIS — D509 Iron deficiency anemia, unspecified: Secondary | ICD-10-CM | POA: Diagnosis present

## 2016-01-20 DIAGNOSIS — Z3403 Encounter for supervision of normal first pregnancy, third trimester: Secondary | ICD-10-CM | POA: Diagnosis present

## 2016-01-20 HISTORY — DX: Gonorrhea complicating pregnancy, second trimester: O98.212

## 2016-01-20 HISTORY — DX: Anemia, unspecified: D64.9

## 2016-01-20 LAB — CBC
HEMATOCRIT: 36.1 % (ref 36.0–46.0)
HEMATOCRIT: 31.6 % — AB (ref 36.0–46.0)
HEMOGLOBIN: 10.9 g/dL — AB (ref 12.0–15.0)
Hemoglobin: 12.5 g/dL (ref 12.0–15.0)
MCH: 27.8 pg (ref 26.0–34.0)
MCH: 27.5 pg (ref 26.0–34.0)
MCHC: 34.6 g/dL (ref 30.0–36.0)
MCHC: 34.5 g/dL (ref 30.0–36.0)
MCV: 80.4 fL (ref 78.0–100.0)
MCV: 79.8 fL (ref 78.0–100.0)
PLATELETS: 243 10*3/uL (ref 150–400)
Platelets: 252 10*3/uL (ref 150–400)
RBC: 4.49 MIL/uL (ref 3.87–5.11)
RBC: 3.96 MIL/uL (ref 3.87–5.11)
RDW: 15.3 % (ref 11.5–15.5)
RDW: 15.3 % (ref 11.5–15.5)
WBC: 7.8 10*3/uL (ref 4.0–10.5)
WBC: 14.2 10*3/uL — AB (ref 4.0–10.5)

## 2016-01-20 LAB — RPR: RPR: NONREACTIVE

## 2016-01-20 MED ORDER — OXYCODONE-ACETAMINOPHEN 5-325 MG PO TABS: 2.0000 | ORAL_TABLET | ORAL | Status: DC | PRN

## 2016-01-20 MED ORDER — OXYCODONE-ACETAMINOPHEN 5-325 MG PO TABS
1.0000 | ORAL_TABLET | Freq: Four times a day (QID) | ORAL | Status: DC | PRN
  Administered 2016-01-20: 1 via ORAL

## 2016-01-20 MED ORDER — MISOPROSTOL 200 MCG PO TABS
ORAL_TABLET | ORAL | Status: AC
  Administered 2016-01-20: 800 ug via OROMUCOSAL
  Filled 2016-01-20: qty 4

## 2016-01-20 MED ORDER — DIBUCAINE 1 % RE OINT: 1.0000 "application " | TOPICAL_OINTMENT | RECTAL | Status: DC | PRN

## 2016-01-20 MED ORDER — OXYTOCIN 40 UNITS IN LACTATED RINGERS INFUSION - SIMPLE MED
2.5000 [IU]/h | INTRAVENOUS | Status: DC
  Filled 2016-01-20: qty 1000

## 2016-01-20 MED ORDER — FENTANYL CITRATE (PF) 100 MCG/2ML IJ SOLN
50.0000 ug | INTRAMUSCULAR | Status: DC | PRN
  Administered 2016-01-20: 50 ug via INTRAVENOUS
  Administered 2016-01-20 (×2): 100 ug via INTRAVENOUS
  Administered 2016-01-20: 50 ug via INTRAVENOUS
  Filled 2016-01-20 (×3): qty 2

## 2016-01-20 MED ORDER — DIPHENHYDRAMINE HCL 25 MG PO CAPS: 25.0000 mg | ORAL_CAPSULE | Freq: Four times a day (QID) | ORAL | Status: DC | PRN

## 2016-01-20 MED ORDER — ONDANSETRON HCL 4 MG PO TABS: 4.0000 mg | ORAL_TABLET | ORAL | Status: DC | PRN

## 2016-01-20 MED ORDER — PENICILLIN G POTASSIUM 5000000 UNITS IJ SOLR
5.0000 10*6.[IU] | Freq: Once | INTRAMUSCULAR | Status: AC
  Administered 2016-01-20: 5 10*6.[IU] via INTRAVENOUS
  Filled 2016-01-20: qty 5

## 2016-01-20 MED ORDER — DEXTROSE 5 % IV SOLN
2.5000 10*6.[IU] | INTRAVENOUS | Status: DC
  Administered 2016-01-20: 2.5 10*6.[IU] via INTRAVENOUS
  Filled 2016-01-20 (×3): qty 2.5

## 2016-01-20 MED ORDER — OXYTOCIN 40 UNITS IN LACTATED RINGERS INFUSION - SIMPLE MED: 10.0000 [IU]/h | INTRAVENOUS | Status: DC

## 2016-01-20 MED ORDER — IBUPROFEN 600 MG PO TABS
600.0000 mg | ORAL_TABLET | Freq: Four times a day (QID) | ORAL | Status: DC
  Administered 2016-01-20 – 2016-01-22 (×8): 600 mg via ORAL
  Filled 2016-01-20 (×8): qty 1

## 2016-01-20 MED ORDER — SENNOSIDES-DOCUSATE SODIUM 8.6-50 MG PO TABS
2.0000 | ORAL_TABLET | ORAL | Status: DC
  Administered 2016-01-21 – 2016-01-22 (×2): 2 via ORAL
  Filled 2016-01-20 (×2): qty 2

## 2016-01-20 MED ORDER — OXYTOCIN BOLUS FROM INFUSION
500.0000 mL | Freq: Once | INTRAVENOUS | Status: AC
  Administered 2016-01-20: 500 mL via INTRAVENOUS

## 2016-01-20 MED ORDER — COCONUT OIL OIL
1.0000 "application " | TOPICAL_OIL | Status: DC | PRN
  Administered 2016-01-21: 1 via TOPICAL
  Filled 2016-01-20: qty 120

## 2016-01-20 MED ORDER — LACTATED RINGERS IV SOLN
INTRAVENOUS | Status: DC
  Administered 2016-01-20 (×2): via INTRAVENOUS

## 2016-01-20 MED ORDER — BENZOCAINE-MENTHOL 20-0.5 % EX AERO
1.0000 "application " | INHALATION_SPRAY | CUTANEOUS | Status: DC | PRN
  Administered 2016-01-20: 1 via TOPICAL
  Filled 2016-01-20: qty 56

## 2016-01-20 MED ORDER — TETANUS-DIPHTH-ACELL PERTUSSIS 5-2.5-18.5 LF-MCG/0.5 IM SUSP
0.5000 mL | Freq: Once | INTRAMUSCULAR | Status: AC
  Administered 2016-01-21: 0.5 mL via INTRAMUSCULAR
  Filled 2016-01-20: qty 0.5

## 2016-01-20 MED ORDER — ACETAMINOPHEN 325 MG PO TABS: 650.0000 mg | ORAL_TABLET | ORAL | Status: DC | PRN

## 2016-01-20 MED ORDER — LIDOCAINE HCL (PF) 1 % IJ SOLN
30.0000 mL | INTRAMUSCULAR | Status: AC | PRN
  Administered 2016-01-20: 30 mL via SUBCUTANEOUS
  Filled 2016-01-20: qty 30

## 2016-01-20 MED ORDER — IBUPROFEN 600 MG PO TABS
600.0000 mg | ORAL_TABLET | Freq: Four times a day (QID) | ORAL | Status: DC | PRN
  Administered 2016-01-20: 600 mg via ORAL
  Filled 2016-01-20: qty 1

## 2016-01-20 MED ORDER — ACETAMINOPHEN 325 MG PO TABS
650.0000 mg | ORAL_TABLET | ORAL | Status: DC | PRN
  Administered 2016-01-20: 650 mg via ORAL
  Filled 2016-01-20: qty 2

## 2016-01-20 MED ORDER — SOD CITRATE-CITRIC ACID 500-334 MG/5ML PO SOLN: 30.0000 mL | ORAL | Status: DC | PRN

## 2016-01-20 MED ORDER — FLEET ENEMA 7-19 GM/118ML RE ENEM
1.0000 | ENEMA | Freq: Every day | RECTAL | Status: DC | PRN
Start: 2016-01-20 — End: 2016-01-20

## 2016-01-20 MED ORDER — HYDROXYZINE HCL 50 MG PO TABS
50.0000 mg | ORAL_TABLET | Freq: Four times a day (QID) | ORAL | Status: DC | PRN
  Filled 2016-01-20: qty 1

## 2016-01-20 MED ORDER — LACTATED RINGERS IV SOLN
500.0000 mL | INTRAVENOUS | Status: DC | PRN
  Administered 2016-01-20: 500 mL via INTRAVENOUS

## 2016-01-20 MED ORDER — METHYLERGONOVINE MALEATE 0.2 MG/ML IJ SOLN
INTRAMUSCULAR | Status: AC
  Administered 2016-01-20: 0.2 mg
  Filled 2016-01-20: qty 1

## 2016-01-20 MED ORDER — WITCH HAZEL-GLYCERIN EX PADS: 1.0000 "application " | MEDICATED_PAD | CUTANEOUS | Status: DC | PRN

## 2016-01-20 MED ORDER — PRENATAL MULTIVITAMIN CH
1.0000 | ORAL_TABLET | Freq: Every day | ORAL | Status: DC
  Administered 2016-01-21 – 2016-01-22 (×2): 1 via ORAL
  Filled 2016-01-20 (×2): qty 1

## 2016-01-20 MED ORDER — OXYCODONE-ACETAMINOPHEN 5-325 MG PO TABS
1.0000 | ORAL_TABLET | ORAL | Status: DC | PRN
  Filled 2016-01-20: qty 1

## 2016-01-20 MED ORDER — ONDANSETRON HCL 4 MG/2ML IJ SOLN: 4.0000 mg | Freq: Four times a day (QID) | INTRAMUSCULAR | Status: DC | PRN

## 2016-01-20 MED ORDER — ZOLPIDEM TARTRATE 5 MG PO TABS
5.0000 mg | ORAL_TABLET | Freq: Every evening | ORAL | Status: DC | PRN
Start: 2016-01-20 — End: 2016-01-22

## 2016-01-20 MED ORDER — ONDANSETRON HCL 4 MG/2ML IJ SOLN: 4.0000 mg | INTRAMUSCULAR | Status: DC | PRN

## 2016-01-20 MED ORDER — SIMETHICONE 80 MG PO CHEW: 80.0000 mg | CHEWABLE_TABLET | ORAL | Status: DC | PRN

## 2016-01-20 NOTE — Anesthesia Pain Management Evaluation Note (Signed)
  [facility]NA Pain Management Visit Note  Patient: Catherine Golden, 20 y.o., female  "Hello I am a member of the anesthesia team at [facility]. We have an anesthesia team available at all times to provide care throughout the hospital, including epidural management and anesthesia for C-section. I don't know your plan for the delivery whether it a natural birth, water birth, IV sedation, nitrous supplementation, doula or epidural, but we want to meet your pain goals."   1.Was your pain managed to your expectations on prior hospitalizations?   No prior hospitalizations  2.What is your expectation for pain management during this hospitalization?     IV pain meds, planning natural birth  3.How can we help you reach that goal? Natural birth with IV pain meds  Record the patient's initial score and the patient's pain goal.   Pain: 6, asking  L&D RN for IV medication  Pain Goal: 10 [facility] wants you to be able to say your pain was always managed very well.  [facility] 01/20/2016

## 2016-01-20 NOTE — Lactation Note (Signed)
This note was copied from a baby's chart. Lactation Consultation Note  Patient Name: Catherine Golden HKFEX'M Date: 01/20/2016 Reason for consult: Initial assessment Breastfeeding consultation services and support information given to patient.  Baby is 6 hours old.  This is mom's first baby.  She states baby latched earlier but her won't latch now.  Baby showing feeding cues.  I assisted mom with positioning baby in cross cradle hold.  Baby opened wide and latched easily.  Good nutritive suckling noted.  Reviewed basics and encouraged to call for assist/concerns prn.  Maternal Data    Feeding Feeding Type: Breast Fed Length of feed: 20 min  LATCH Score/Interventions Latch: Grasps breast easily, tongue down, lips flanged, rhythmical sucking. Intervention(s): Breast compression;Breast massage;Assist with latch;Adjust position  Audible Swallowing: A few with stimulation Intervention(s): Hand expression Intervention(s): Alternate breast massage  Type of Nipple: Everted at rest and after stimulation  Comfort (Breast/Nipple): Soft / non-tender     Hold (Positioning): Assistance needed to correctly position infant at breast and maintain latch. Intervention(s): Breastfeeding basics reviewed;Support Pillows;Skin to skin;Position options  LATCH Score: 8  Lactation Tools Discussed/Used     Consult Status Consult Status: Follow-up Date: 01/21/16 Follow-up type: In-patient    Huston Foley 01/20/2016, 2:23 PM

## 2016-01-20 NOTE — H&P (Signed)
Obstetric History and Physical  Nanette Hurrell Pettinato is a 20 y.o. G1P0000 with IUP at [redacted]w[redacted]d presenting for labor. Patient states she has been having regular every 3 minutes contractions, minimal vaginal bleeding, intact membranes, with active fetal movement.    Prenatal Course Source of Care: [facility]  Pregnancy complications or risks: Admitted from 09/25/15 to 11/24/15 (23 weeks to [redacted] weeks GA) for observation due to shortened cervix. Had positive GC culture on 09/25/15, treated. Negative TOC on 12/06/15.  Also received iron infusions for anemia in second and third trimester.  Patient Active Problem List   Diagnosis Date Noted  . Active labor at term 01/20/2016  . Short cervix in second trimester, antepartum 01/20/2016  . Gonorrhea affecting pregnancy in second trimester 01/20/2016  . Supervision of normal pregnancy in third trimester 01/16/2016  . GBS (group B Streptococcus carrier), +RV culture, currently pregnant 01/16/2016  . Anemia, iron deficiency   . Asthma, mild intermittent, well-controlled 09/07/2013   She plans to breastfeed She is undecided postpartum contraception.   Prenatal labs and studies: ABO, Rh: --/--/B POS (05/31 3300) Antibody: NEG (05/31 0738) Rubella: 4.42 (01/09 1031) RPR: NON REAC (01/09 1031)  HBsAg: NEGATIVE (01/09 1031)  HIV: Non Reactive (04/03 1316)  TMA:UQJFHLKT (06/22 1452) 2 hr Glucola  Normal Genetic screening normal Anatomy US normal  Prenatal Transfer Tool  Maternal Diabetes: No Genetic Screening: Normal Maternal Ultrasounds/Referrals: Normal Fetal Ultrasounds or other Referrals:  None Maternal Substance Abuse:  No Significant Maternal Medications:  None Significant Maternal Lab Results: Lab values include: Group B Strep positive, Other: Positive GC on 09/25/15.  Past Medical History:  Diagnosis Date  . Asthma   . Gonorrhea affecting pregnancy in second trimester 01/20/2016   Had positive GC culture on 09/25/15, treated. Negative TOC on 12/06/15.      Past Surgical History:  Procedure Laterality Date  . WISDOM TOOTH EXTRACTION      OB History  Gravida Para Term Preterm AB Living  1 0 0 0 0 0  SAB TAB Ectopic Multiple Live Births  0 0 0 0      # Outcome Date GA Lbr Len/2nd Weight Sex Delivery Anes PTL Lv  1 Current               Social History   Social History  . Marital status: Single    Spouse name: N/A  . Number of children: N/A  . Years of education: N/A   Social History Main Topics  . Smoking status: Passive Smoke Exposure - Never Smoker  . Smokeless tobacco: Never Used  . Alcohol use No  . Drug use: No  . Sexual activity: Yes    Partners: Male    Birth control/ protection: None   Other Topics Concern  . Not on file   Social History Narrative  . No narrative on file    Family History  Problem Relation Age of Onset  . Hypertension Father   . Diabetes Paternal Grandmother   . Hypertension Paternal Grandmother   . Cancer Neg Hx   . Hypertension Paternal Grandfather   . Cystic fibrosis Paternal Grandfather   . Diabetes Mellitus II Paternal Grandfather     Prescriptions Prior to Admission  Medication Sig Dispense Refill Last Dose  . acetaminophen (TYLENOL) 325 MG tablet Take 2 tablets (650 mg total) by mouth every 4 (four) hours as needed for mild pain (OR temperature greater than 100.5 F). (Patient not taking: Reported on 01/16/2016) 500 tablet 1 Not Taking  .  albuterol (PROVENTIL HFA;VENTOLIN HFA) 108 (90 Base) MCG/ACT inhaler Inhale 2 puffs into the lungs every 4 (four) hours as needed for wheezing. 2 Inhaler 11 Taking  . ondansetron (ZOFRAN) 4 MG tablet Take 1 tablet (4 mg total) by mouth daily as needed. (Patient not taking: Reported on 01/09/2016) 30 tablet 1 Not Taking  . Prenat-Fe Poly-Methfol-FA-DHA (VITAFOL ULTRA) 29-0.6-0.4-200 MG CAPS Take by mouth.   Taking  . promethazine (PHENERGAN) 25 MG tablet Take 1 tablet (25 mg total) by mouth every 6 (six) hours as needed for nausea or vomiting.  (Patient not taking: Reported on 01/09/2016) 30 tablet 2 Not Taking    No Known Allergies  Review of Systems: Negative except for what is mentioned in HPI.  Physical Exam: BP (P) 113/88   Pulse (P) 111   Resp (P) 18   LMP 04/14/2015 (Exact Date)  CONSTITUTIONAL: Well-developed, well-nourished female in no acute distress.  HENT:  Normocephalic, atraumatic, External right and left ear normal. Oropharynx is clear and moist EYES: Conjunctivae and EOM are normal. Pupils are equal, round, and reactive to light. No scleral icterus.  NECK: Normal range of motion, supple, no masses SKIN: Skin is warm and dry. No rash noted. Not diaphoretic. No erythema. No pallor. NEUROLOGIC: Alert and oriented to person, place, and time. Normal reflexes, muscle tone coordination. No cranial nerve deficit noted. PSYCHIATRIC: Normal mood and affect. Normal behavior. Normal judgment and thought content. CARDIOVASCULAR: Normal heart rate noted, regular rhythm RESPIRATORY: Effort and breath sounds normal, no problems with respiration noted ABDOMEN: Soft, nontender, nondistended, gravid. MUSCULOSKELETAL: Normal range of motion. No edema and no tenderness. 2+ distal pulses.  Cervical Exam: Dilatation 5cm   Effacement 100%   Station 0   Presentation: cephalic FHT:  Baseline rate 125 bpm   Variability moderate  Accelerations present   Decelerations none Contractions: Every 2 -3 mins   Pertinent Labs/Studies:   No results found for this or any previous visit (from the past 24 hour(s)).  Assessment : WILLEEN NOVAK is a 20 y.o. G1P0000 at [redacted]w[redacted]d being admitted for labor.  Plan: Labor: Expectant management. Augmentation as needed, per protocol FWB: Reassuring fetal heart tracing.  GBS positive, PCN ordered. Delivery plan: Hopeful for vaginal delivery. Plans to breastfeed, will offer inhouse Nexplanon.   Jaynie Collins, MD, FACOG Attending Obstetrician & Gynecologist Faculty Practice, [facility]  [place]

## 2016-01-20 NOTE — Progress Notes (Signed)
Patient ID: Catherine Golden, female   DOB: 11/08/1995, 20 y.o.   MRN: [id number]  Pt now 4 hrs post initial PCN dose; coping well with ctx using Fentanyl  VSS, afeb FHR 115-120s, +accels, avg LTV, occ variables Ctx q 3 mins, spontaneous Cx 9+cm; AROM for scant clear/pink fluid  IUP@term  Transition  Expect pt to have stronger urge to push soon Rec sitting on ball/toilet  SHAW, KIMBERLY 01/20/2016 10:09 AM

## 2016-01-20 NOTE — MAU Note (Signed)
Pt report ctx fr about an hour. Denies SROM or bleeding. Good fetal movement

## 2016-01-21 LAB — CBC
HEMATOCRIT: 25.7 % — AB (ref 36.0–46.0)
HEMOGLOBIN: 8.8 g/dL — AB (ref 12.0–15.0)
MCH: 26.9 pg (ref 26.0–34.0)
MCHC: 34.2 g/dL (ref 30.0–36.0)
MCV: 78.6 fL (ref 78.0–100.0)
Platelets: 220 10*3/uL (ref 150–400)
RBC: 3.27 MIL/uL — AB (ref 3.87–5.11)
RDW: 15.3 % (ref 11.5–15.5)
WBC: 11.7 10*3/uL — ABNORMAL HIGH (ref 4.0–10.5)

## 2016-01-21 NOTE — Progress Notes (Signed)
Post Partum Day 1 Subjective: no complaints, up ad lib, voiding and tolerating PO  Objective: Blood pressure (!) 117/51, pulse 82, temperature 98.6 F (37 C), resp. rate 18, height 5' (1.524 m), weight 144 lb (65.3 kg), last menstrual period 04/14/2015, SpO2 100 %, unknown if currently breastfeeding.  Physical Exam:  General: alert, cooperative, appears stated age and no distress Lochia: appropriate Uterine Fundus: firm Incision: n/a DVT Evaluation: No evidence of DVT seen on physical exam. Negative Homan's sign.   Recent Labs  01/20/16 0950 01/21/16 0542  HGB 10.9* 8.8*  HCT 31.6* 25.7*    Assessment/Plan: Plan for discharge tomorrow   LOS: 1 day   Catherine Golden 01/21/2016, 7:57 AM

## 2016-01-21 NOTE — Lactation Note (Signed)
This note was copied from a baby's chart. Lactation Consultation Note  Patient Name: Catherine Golden IFOYD'X Date: 01/21/2016 Reason for consult: Follow-up assessment  Infant latches w/relative ease; however, infrequent swallows were noted (7-10 sucks/swallow). Swallows verified by cervical auscultation.   No urine in almost 24 hours.  Hand expression was taught to Mom & she yielded colostrum easily. We hand expressed some into a colostrum vial & Mom will call me to help spoon-feed it to infant once he is ready to feed again.   Mom reports + breast changes w/pregnancy.   Lurline Hare [facility] 01/21/2016, 5:42 PM

## 2016-01-21 NOTE — Lactation Note (Addendum)
This note was copied from a baby's chart. Lactation Consultation Note  Patient Name: Catherine Golden Date: 01/21/2016 Reason for consult: Follow-up assessment  Infant was spoon-fed 7 mL of EBM w/ease. Infant then went to the R breast w/success. Frequent swallows were verified by cervical auscultation.   Mom was taught signs/sound of swallowing.  Note: Infant's tongue slightly heart-shaped on extension when spoon-feeding.   Lurline Hare [facility] 01/21/2016, 6:14 PM

## 2016-01-21 NOTE — Discharge Planning (Addendum)
Patient's mother asked for a shower chair to give patient a shower.  This RN asked patient's mother if patient was feeling lightheaded, dizzy, short of breath, or having any other symptoms that this RN would gladly check and report to MD.  The patient's mother stated that she wanted to give patient a shower and felt it would be easier if patient sat.  Per patient's mother, patient is able to shower self and does not need to be assessed for home care assistance.  Patient's mother seemed a little upset with this RN regarding asking about patient's status to make sure patient does not need home care.  This RN was simply concerned regarding the request for a shower chair and the need for the patient to be bathed by another person.

## 2016-01-22 MED ORDER — IBUPROFEN 600 MG PO TABS: 600.0000 mg | ORAL_TABLET | Freq: Four times a day (QID) | ORAL | 0 refills | Status: DC

## 2016-01-22 NOTE — Progress Notes (Signed)
UR chart review completed.  

## 2016-01-22 NOTE — Lactation Note (Signed)
This note was copied from a baby's chart. Lactation Consultation Note  Patient Name: Catherine Golden GYIRS'W Date: 01/22/2016 Reason for consult: Follow-up assessment Mom reports baby is nursing well. She is hand expressing and letting FOB give EBM via bottle with some feedings. Mom reports she was tired last night so FOB gave supplement of formula. Basic teaching reviewed with parents. Stressed importance of BF with each feeding to encourage milk production, prevent engorgement and protect milk supply. If Mom chooses not to put baby to breast, advised she needs to pump. Reviewed supply/demand. Mom reports her breasts are changing but not engorged. Engorgement care reviewed if needed. Advised baby should be at breast 8-12 times in 24 hours and with feeding ques. Mom reports having some nipple tenderness, denies breakdown. Using EBM/coconut oil prn.  LC left phone number for Mom to call with next feeding for latch check before d/c home today.   Maternal Data    Feeding Feeding Type: Breast Fed Nipple Type: Slow - flow Length of feed: 15 min  LATCH Score/Interventions                      Lactation Tools Discussed/Used Tools: Pump Breast pump type: Manual   Consult Status Consult Status: Follow-up Date: 01/22/16 Follow-up type: In-patient    Alfred Levins 01/22/2016, 9:28 AM

## 2016-01-22 NOTE — Lactation Note (Signed)
This note was copied from a baby's chart. Lactation Consultation Note  Patient Name: Catherine Golden Date: 01/22/2016 Reason for consult: Follow-up assessment Mom called for LC to observe latch. Mom latching baby in cradle hold, LC adjusted position and demonstrated how to un-tuck lower lip, use breast compression for baby to obtain more depth at breast. Baby demonstrated some good suckling bursts but sleepy. Demonstrated to parents ways to keep baby awake at breast, few swallows noted with stimulation. Some intermittent chewing observed. Mom having some carpul tunnel symptoms. Worked with Mom on positioning to help with this. Mom denies discomfort with baby at breast. Encouraged to keep baby nursing for 15-20 minutes both breasts most feedings, encouraged to monitor void/stools, refer to Baby N Me booklet page 24. Encouraged to call for questions/concerns.   Maternal Data    Feeding Feeding Type: Breast Fed Nipple Type: Slow - flow Length of feed: 15 min  LATCH Score/Interventions Latch: Grasps breast easily, tongue down, lips flanged, rhythmical sucking. Intervention(s): Adjust position;Assist with latch;Breast massage;Breast compression  Audible Swallowing: A few with stimulation  Type of Nipple: Everted at rest and after stimulation  Comfort (Breast/Nipple): Filling, red/small blisters or bruises, mild/mod discomfort  Problem noted: Mild/Moderate discomfort Interventions (Mild/moderate discomfort): Hand expression;Hand massage (EBM/coconut oil to tender nipples, no breakdown noted)  Hold (Positioning): Assistance needed to correctly position infant at breast and maintain latch. Intervention(s): Breastfeeding basics reviewed;Support Pillows;Position options;Skin to skin  LATCH Score: 7  Lactation Tools Discussed/Used Tools: Pump Breast pump type: Manual   Consult Status Consult Status: Complete Date: 01/22/16 Follow-up type: In-patient    Alfred Levins 01/22/2016, 10:08 AM

## 2016-01-22 NOTE — Discharge Summary (Signed)
OB Discharge Summary  Patient Name: Catherine Golden DOB: [birth year] MRN: [id number]  Date of admission: 01/20/2016 Delivering MD: Cam Hai D   Date of discharge: 01/22/2016  Admitting diagnosis: LABOR Intrauterine pregnancy: [redacted]w[redacted]d     Secondary diagnosis:Principal Problem:   Active labor at term Active Problems:   GBS (group B Streptococcus carrier), +RV culture, currently pregnant  Additional problems:none     Discharge diagnosis: Term Pregnancy Delivered                                                                     Post partum procedures:none  Augmentation: Pitocin  Complications: None  Hospital course:  Onset of Labor With Vaginal Delivery     20 y.o. yo G1P1001 at [redacted]w[redacted]d was admitted in Active Labor on 01/20/2016. Patient had an uncomplicated labor course as follows:  Membrane Rupture Time/Date: 6:53 AM ,01/20/2016   Intrapartum Procedures: Episiotomy: None [1]                                         Lacerations:  1st degree [2]  Patient had a delivery of a Viable infant. 01/20/2016  Information for the patient's newborn:  Michaiah, Maiden [[id number]]  Delivery Method: Vaginal, Spontaneous Delivery (Filed from Delivery Summary)    Pateint had an uncomplicated postpartum course.  She is ambulating, tolerating a regular diet, passing flatus, and urinating well. Patient is discharged home in stable condition on 01/22/16.    Physical exam Vitals:   01/20/16 1529 01/20/16 1805 01/21/16 0553 01/21/16 1715  BP: 117/62 122/61 (!) 117/51 126/76  Pulse: 83 91 82 87  Resp: Temp: 98.2 F (36.8 C) 98.9 F (37.2 C) 98.6 F (37 C) 98.7 F (37.1 C)  TempSrc: Oral   Oral  SpO2:    100%  Weight:      Height:       General: alert, cooperative and no distress Lochia: appropriate Uterine Fundus: firm Incision: N/A DVT Evaluation: No evidence of DVT seen on physical exam. Negative Homan's sign. Labs: Lab Results  Component Value Date    WBC 11.7 (H) 01/21/2016   HGB 8.8 (L) 01/21/2016   HCT 25.7 (L) 01/21/2016   MCV 78.6 01/21/2016   PLT 220 01/21/2016   CMP Latest Ref Rng & Units 09/27/2015  Glucose 65 - 99 mg/dL 213(Y)  BUN 6 - 20 mg/dL <8(M)  Creatinine 5.78 - 1.00 mg/dL 4.69(G)  Sodium 295 - 284 mmol/L 137  Potassium 3.5 - 5.1 mmol/L 3.5  Chloride 101 - 111 mmol/L 106  CO2 22 - 32 mmol/L 24  Calcium 8.9 - 10.3 mg/dL 1.3(K)  Total Protein 6.5 - 8.1 g/dL 6.8  Total Bilirubin 0.3 - 1.2 mg/dL 4.4(W)  Alkaline Phos 38 - 126 U/L 72  AST 15 - 41 U/L 22  ALT 14 - 54 U/L 12(L)    Discharge instruction: per After Visit Summary and "Baby and Me Booklet".  After Visit Meds:    Medication List    STOP taking these medications   acetaminophen 325 MG tablet Commonly known as:  TYLENOL   ondansetron  4 MG tablet Commonly known as:  ZOFRAN   promethazine 25 MG tablet Commonly known as:  PHENERGAN     TAKE these medications   albuterol 108 (90 Base) MCG/ACT inhaler Commonly known as:  PROVENTIL HFA;VENTOLIN HFA Inhale 2 puffs into the lungs every 4 (four) hours as needed for wheezing.   ibuprofen 600 MG tablet Commonly known as:  ADVIL,MOTRIN Take 1 tablet (600 mg total) by mouth every 6 (six) hours.   VITAFOL ULTRA 29-0.6-0.4-200 MG Caps Take 1 capsule by mouth at bedtime.       Diet: routine diet  Activity: Advance as tolerated. Pelvic rest for 6 weeks.   Outpatient follow up:6 weeks Follow up Appt:Future Appointments Date Time Provider Department Center  01/26/2016 10:30 AM Roe Coombs, CNM [facility]  02/29/2016 8:30 AM [facility] LAB 1 [facility] None  03/07/2016 8:45 AM Malachy Mood, MD [facility] None  03/07/2016 9:30 AM [facility] F20 [facility] None   Follow up visit: No Follow-up on file.  Postpartum contraception: Depo Provera  Newborn Data: Live born female  Birth Weight: 7 lb 5.1 oz (3320 g) APGAR: 9, 9  Baby Feeding: Breast Disposition:home with mother   01/22/2016 Catherine Golden, CNM

## 2016-01-26 ENCOUNTER — Encounter: Payer: Medicaid Other | Admitting: Certified Nurse Midwife

## 2016-02-01 ENCOUNTER — Ambulatory Visit (INDEPENDENT_AMBULATORY_CARE_PROVIDER_SITE_OTHER): Payer: Medicaid Other | Admitting: Obstetrics

## 2016-02-01 ENCOUNTER — Encounter: Payer: Self-pay | Admitting: Obstetrics

## 2016-02-01 DIAGNOSIS — Z30013 Encounter for initial prescription of injectable contraceptive: Secondary | ICD-10-CM

## 2016-02-01 DIAGNOSIS — Z3009 Encounter for other general counseling and advice on contraception: Secondary | ICD-10-CM

## 2016-02-01 MED ORDER — MEDROXYPROGESTERONE ACETATE 150 MG/ML IM SUSP: 150.0000 mg | INTRAMUSCULAR | 3 refills | Status: DC

## 2016-02-01 NOTE — Progress Notes (Signed)
Subjective:     Catherine Golden is a 20 y.o. female who presents for a postpartum visit. She is 2 weeks postpartum following a spontaneous vaginal delivery. I have fully reviewed the prenatal and intrapartum course. The delivery was at 40 gestational weeks. Outcome: spontaneous vaginal delivery. Anesthesia: IV sedation. Postpartum course has been normal. Baby's course has been normal. Baby is feeding by breast. Bleeding thin lochia. Bowel function is normal. Bladder function is normal. Patient is not sexually active. Contraception method is abstinence. Postpartum depression screening: negative.  Tobacco, alcohol and substance abuse history reviewed.  Adult immunizations reviewed including TDAP, rubella and varicella.  The following portions of the patient's history were reviewed and updated as appropriate: allergies, current medications, past family history, past medical history, past social history, past surgical history and problem list.  Review of Systems A comprehensive review of systems was negative except for: Gastrointestinal: positive for constipation   Objective:    BP 136/87   Pulse 76   LMP 04/14/2015 (Exact Date)    PE:  Deferred   Assessment:    2 weeks postpartum.  Doing well  Contraceptive counseling and advice.  Wants Depo Provera.  Plan:    1. Contraception: Depo-Provera injections 2. Depo Provera R 3. Follow up in: 4 weeks or as needed.   Healthy lifestyle practices reviewed

## 2016-02-28 ENCOUNTER — Telehealth: Payer: Self-pay | Admitting: Hematology

## 2016-02-28 ENCOUNTER — Encounter: Payer: Self-pay | Admitting: Obstetrics

## 2016-02-28 ENCOUNTER — Encounter: Payer: Self-pay | Admitting: *Deleted

## 2016-02-28 ENCOUNTER — Ambulatory Visit (INDEPENDENT_AMBULATORY_CARE_PROVIDER_SITE_OTHER): Payer: Medicaid Other | Admitting: Obstetrics

## 2016-02-28 DIAGNOSIS — Z30013 Encounter for initial prescription of injectable contraceptive: Secondary | ICD-10-CM

## 2016-02-28 DIAGNOSIS — Z3042 Encounter for surveillance of injectable contraceptive: Secondary | ICD-10-CM

## 2016-02-28 DIAGNOSIS — Z3009 Encounter for other general counseling and advice on contraception: Secondary | ICD-10-CM | POA: Diagnosis not present

## 2016-02-28 MED ORDER — MEDROXYPROGESTERONE ACETATE 150 MG/ML IM SUSP
150.0000 mg | INTRAMUSCULAR | Status: AC
  Administered 2016-02-28 – 2016-08-14 (×2): 150 mg via INTRAMUSCULAR

## 2016-02-28 NOTE — Progress Notes (Signed)
Subjective:     Catherine Golden is a 20 y.o. female who presents for a postpartum visit. She is 6 weeks postpartum following a spontaneous vaginal delivery. I have fully reviewed the prenatal and intrapartum course. The delivery was at 40 gestational weeks. Outcome: spontaneous vaginal delivery. Anesthesia: none. Postpartum course has been normal. Baby's course has been normal. Baby is feeding by both breast and bottle - Similac Advance. Bleeding no bleeding. Bowel function is normal. Bladder function is normal. Patient is not sexually active. Contraception method is Depo-Provera injections. Postpartum depression screening: negative.  Tobacco, alcohol and substance abuse history reviewed.  Adult immunizations reviewed including TDAP, rubella and varicella.  The following portions of the patient's history were reviewed and updated as appropriate: allergies, current medications, past family history, past medical history, past social history, past surgical history and problem list.  Review of Systems A comprehensive review of systems was negative.   Objective:    BP 127/83   Pulse 77   Temp 98.4 F (36.9 C)   Wt 129 lb 8 oz (58.7 kg)   LMP 04/14/2015 (Exact Date)   Breastfeeding? Yes   BMI 25.29 kg/m   General:  alert and no distress   Breasts:  inspection negative, no nipple discharge or bleeding, no masses or nodularity palpable  Lungs: clear to auscultation bilaterally  Heart:  regular rate and rhythm, S1, S2 normal, no murmur, click, rub or gallop  Abdomen: soft, non-tender; bowel sounds normal; no masses,  no organomegaly   Vulva:  normal  Vagina: normal vagina, no discharge, exudate, lesion, or erythema  Cervix:  no cervical motion tenderness and no lesions  Corpus: normal size, contour, position, consistency, mobility, non-tender  Adnexa:  no mass, fullness, tenderness  Rectal Exam: Not performed.          50% of 15 min visit spent on counseling and coordination of care.    Assessment:     Normal postpartum exam. Pap smear not done at today's visit.    Plan:    1. Contraception: Depo-Provera injections 2. Depo Provera Rx 3. Follow up in: 1 year or as needed.   Healthy lifestyle practices reviewed

## 2016-02-28 NOTE — Telephone Encounter (Signed)
02/29/2016 Appointment rescheduled to 03/01/2016 per patient request. Per patient needed earliest appointment available on a different day.

## 2016-02-29 ENCOUNTER — Other Ambulatory Visit: Payer: Medicaid Other

## 2016-03-01 ENCOUNTER — Other Ambulatory Visit: Payer: Medicaid Other

## 2016-03-07 ENCOUNTER — Ambulatory Visit: Payer: Medicaid Other

## 2016-03-07 ENCOUNTER — Ambulatory Visit: Payer: Medicaid Other | Admitting: Hematology

## 2016-03-07 NOTE — Progress Notes (Deleted)
[facility]  [phone] [phone]  Clinic Follow up Note   Patient Care Team: Cherece Griffith Citron, MD as PCP - General (Pediatrics) 03/07/2016  CHIEF COMPLAINT: Follow-up iron deficient anemia  History of present illness (10/03/2015) Ms. Docter is a 20 year old female without significant past medical history, with 24w pregnancy, was [facility] to [facility] for preterm cervical change. She was noticed to have worsening anemia, and hematology consult was called.  This is a her first pregnancy. She denies past medical history of anemia. She did have heavy period, especially on the 2 and 3 she uses extra large tampon, your hemoglobin was around 11 in 3 months ago. She has been on oral partners such a right one tablet daily and multivitamins. She was admitted 8 days ago, hemoglobin was 8.1, was hematocrit 24.6, MCV 76.6, and her her CBC 4-6 days ago showed hemoglobin 6.8-7.0. She denies any signs of bleeding, no hematochezia or melena. She feels well overall, denies any dyspnea or chest discomfort with exertion. She is at bedrest, able to use bathroom on her own.   CURRENT THERAPY: IV FERAHEME 510mg  as needed, oral ferrous sulfate 1-2 tablets a day  INTERVAL HISTORY: Heloise returns for follow-up. I first saw her in [facility] in April 2017 for her iron deficient anemia. She received IV Feraheme twice in the hospital and tolerate well. She has been taking oral iron since then also, but not very compliant. She states she eats iron-enriched food. Her pregnancy is going well, she is due next week. She has a mild fatigue, able to function very well. No other complaints.  REVIEW OF SYSTEMS:   Constitutional: Denies fevers, chills or abnormal weight loss Eyes: Denies blurriness of vision Ears, nose, mouth, throat, and face: Denies mucositis or sore throat Respiratory: Denies cough, dyspnea or wheezes Cardiovascular: Denies palpitation,  chest discomfort or lower extremity swelling Gastrointestinal:  Denies nausea, heartburn or change in bowel habits Skin: Denies abnormal skin rashes Lymphatics: Denies new lymphadenopathy or easy bruising Neurological:Denies numbness, tingling or new weaknesses Behavioral/Psych: Mood is stable, no new changes  All other systems were reviewed with the patient and are negative.  MEDICAL HISTORY:  Past Medical History:  Diagnosis Date  . Anemia   . Asthma   . Gonorrhea affecting pregnancy in second trimester 01/20/2016   Had positive GC culture on 09/25/15, treated. Negative TOC on 12/06/15.     SURGICAL HISTORY: Past Surgical History:  Procedure Laterality Date  . WISDOM TOOTH EXTRACTION      I have reviewed the social history and family history with the patient and they are unchanged from previous note.  ALLERGIES:  has No Known Allergies.  MEDICATIONS:  Current Outpatient Prescriptions  Medication Sig Dispense Refill  . albuterol (PROVENTIL HFA;VENTOLIN HFA) 108 (90 Base) MCG/ACT inhaler Inhale 2 puffs into the lungs every 4 (four) hours as needed for wheezing. 2 Inhaler 11  . ibuprofen (ADVIL,MOTRIN) 600 MG tablet Take 1 tablet (600 mg total) by mouth every 6 (six) hours. 30 tablet 0  . medroxyPROGESTERone (DEPO-PROVERA) 150 MG/ML injection Inject 1 mL (150 mg total) into the muscle every 3 (three) months. 1 mL 3  . Prenat-Fe Poly-Methfol-FA-DHA (VITAFOL ULTRA) 29-0.6-0.4-200 MG CAPS Take 1 capsule by mouth at bedtime.      Current Facility-Administered Medications  Medication Dose Route Frequency Provider Last Rate Last Dose  . medroxyPROGESTERone (DEPO-PROVERA) injection 150 mg  150 mg Intramuscular Q90 days Brock Bad, MD   150 mg  at 02/28/16 1136    PHYSICAL EXAMINATION: ECOG PERFORMANCE STATUS: 1 - Symptomatic but completely ambulatory  There were no vitals filed for this visit. There were no vitals filed for this visit.  GENERAL:alert, no distress and  comfortable SKIN: skin color, texture, turgor are normal, no rashes or significant lesions EYES: normal, Conjunctiva are pink and non-injected, sclera clear OROPHARYNX:no exudate, no erythema and lips, buccal mucosa, and tongue normal  NECK: supple, thyroid normal size, non-tender, without nodularity LYMPH:  no palpable lymphadenopathy in the cervical, axillary or inguinal LUNGS: clear to auscultation and percussion with normal breathing effort HEART: regular rate & rhythm and no murmurs and no lower extremity edema ABDOMEN:abdomen distended due to pregnancy, soft, non-tender and normal bowel sounds Musculoskeletal:no cyanosis of digits and no clubbing  NEURO: alert & oriented x 3 with fluent speech, no focal motor/sensory deficits  LABORATORY DATA:  I have reviewed the data as listed CBC Latest Ref Rng & Units 01/21/2016 01/20/2016 01/20/2016  WBC 4.0 - 10.5 K/uL 11.7(H) 14.2(H) 7.8  Hemoglobin 12.0 - 15.0 g/dL 7.8(G8.8(L) 10.9(L) 12.5  Hematocrit 36.0 - 46.0 % 25.7(L) 31.6(L) 36.1  Platelets 150 - 400 K/uL 220 243 252    CMP Latest Ref Rng & Units 09/27/2015  Glucose 65 - 99 mg/dL 956(O101(H)  BUN 6 - 20 mg/dL <1(H<5(L)  Creatinine 0.860.44 - 1.00 mg/dL 5.78(I0.30(L)  Sodium 696135 - 295145 mmol/L 137  Potassium 3.5 - 5.1 mmol/L 3.5  Chloride 101 - 111 mmol/L 106  CO2 22 - 32 mmol/L 24  Calcium 8.9 - 10.3 mg/dL 2.8(U8.8(L)  Total Protein 6.5 - 8.1 g/dL 6.8  Total Bilirubin 0.3 - 1.2 mg/dL 1.3(K0.1(L)  Alkaline Phos 38 - 126 U/L 72  AST 15 - 41 U/L 22  ALT 14 - 54 U/L 12(L)   Results for Estelle JuneWALLINGTON, Dyane D (MRN [id number]) as of 01/11/2016 14:06  Ref. Range 10/20/2015 08:52 12/21/2015 11:18 01/11/2016 10:14  Iron Latest Ref Range: 41-142 ug/dL 366105 44 52  UIBC Latest Ref Range: 120-384 ug/dL 440260 347403 (H) 425436 (H)  TIBC Latest Ref Range: 236-444 ug/dL 956365 387448 (H) 564488 (H)  %SAT Latest Ref Range: 21-57 %  10 (L) 11 (L)  Saturation Ratios Latest Ref Range: 10.4-31.8 % 29    Ferritin Latest Ref Range: 9-269 ng/ml 421 (H) 14 13        RADIOGRAPHIC STUDIES: I have personally reviewed the radiological images as listed and agreed with the findings in the report. No results found.   ASSESSMENT & PLAN:  20 yo female w/o significant past medical history, currently pregnant, presented with worsening anemia   1. Iron deficienct anemia  -She has microcytic anemia, initial iron studies showed ferritin 5, low transferrin saturation 7%, significant elevated TIBC, this is all consistent with iron deficient anemia, likely secondary to her pregnancy. She did have menorrhagia before pregnancy. -she has no clinical signs of bleeding, stool OB was negative  -She responded to IV Feraheme very well, her anemia has significantly improved. Her hemoglobin 11.9 today -She still has iron deficiency, she is not compliant with oral iron pill, I encouraged her to restart ferrous sulfate 1-2 tablets a day, with stool softener for constipation. -She is due for delivery next week, I think her anemia is nearly resolved, we'll hold on IV Feraheme for now. -I'll see her back one month after her delivery and repeat her iron study. If still low, I'll set up IV Feraheme at that time.  Plan -She will restart oral iron  pill -She is due for delivery next week -I'll see her back in 7-8 weeks, with lab 1 week before.  All questions were answered. The patient knows to call the clinic with any problems, questions or concerns. No barriers to learning was detected. I spent 20 minutes counseling the patient face to face. The total time spent in the appointment was 25 minutes and more than 50% was on counseling and review of test results     Malachy Mood, MD 03/07/16

## 2016-04-11 ENCOUNTER — Telehealth: Payer: Self-pay | Admitting: *Deleted

## 2016-04-11 ENCOUNTER — Other Ambulatory Visit: Payer: Self-pay | Admitting: Obstetrics

## 2016-04-11 DIAGNOSIS — Z30011 Encounter for initial prescription of contraceptive pills: Secondary | ICD-10-CM

## 2016-04-11 MED ORDER — NORETHINDRONE 0.35 MG PO TABS: 1.0000 | ORAL_TABLET | Freq: Every day | ORAL | 11 refills | Status: DC

## 2016-04-11 NOTE — Telephone Encounter (Signed)
Patient does not want to continue with the Depo provera anymore. She would like to start the POP because she is still breastfeeding. Told patient to start the pill the week before her next shot is due.

## 2016-04-30 ENCOUNTER — Telehealth: Payer: Self-pay | Admitting: *Deleted

## 2016-04-30 NOTE — Telephone Encounter (Signed)
Patient has questions about the Depo vs the pills. She is still bleeding daily. She wants to continue the Depo- but doesn't want to bleed. She wants to try 1 more shot.

## 2016-05-07 ENCOUNTER — Emergency Department (HOSPITAL_COMMUNITY)
Admission: EM | Admit: 2016-05-07 | Discharge: 2016-05-07 | Disposition: A | Discharge status: Home / Self Care | Payer: Medicaid Other | Attending: Emergency Medicine | Admitting: Emergency Medicine

## 2016-05-07 ENCOUNTER — Encounter (HOSPITAL_COMMUNITY): Payer: Self-pay | Admitting: Emergency Medicine

## 2016-05-07 DIAGNOSIS — J029 Acute pharyngitis, unspecified: Secondary | ICD-10-CM | POA: Diagnosis present

## 2016-05-07 DIAGNOSIS — Z79899 Other long term (current) drug therapy: Secondary | ICD-10-CM | POA: Insufficient documentation

## 2016-05-07 DIAGNOSIS — J45909 Unspecified asthma, uncomplicated: Secondary | ICD-10-CM | POA: Insufficient documentation

## 2016-05-07 DIAGNOSIS — J069 Acute upper respiratory infection, unspecified: Secondary | ICD-10-CM | POA: Insufficient documentation

## 2016-05-07 DIAGNOSIS — B9789 Other viral agents as the cause of diseases classified elsewhere: Secondary | ICD-10-CM

## 2016-05-07 LAB — RAPID STREP SCREEN (MED CTR MEBANE ONLY): Streptococcus, Group A Screen (Direct): NEGATIVE

## 2016-05-07 LAB — POC URINE PREG, ED: Preg Test, Ur: NEGATIVE

## 2016-05-07 NOTE — ED Provider Notes (Signed)
[facility]-EMERGENCY DEPT Provider Note   CSN: [id number] Arrival date & time: 05/07/16  1135   By signing my name below, I, [name], attest that this documentation has been prepared under the direction and in the presence of [name], NP. Electronically Signed: [name], ED Scribe. 05/07/2016. 1:09 PM.   History   Chief Complaint Chief Complaint  Patient presents with  . Otalgia  . Cough  . Nasal Congestion  . Sore Throat    The history is provided by the patient. No language interpreter was used.  URI   This is a new problem. Episode onset: 1 week ago. There has been no fever. Associated symptoms include vomiting, congestion, ear pain, rhinorrhea, sneezing, sore throat and cough.   HPI Comments:  Catherine Golden is a 20 y.o. female who presents to the Emergency Department complaining of a constant sore throat x 1 week. Pt complains of associated bilateral ear pain, nasal congestion, non-productive cough, rhinorrhea, sneezing. Pt reports 1 episode of vomiting during the past week. Pt is requesting a pregnancy test, but notes having 2 negative tests at home. Pt states that her symptoms are similar to the last time she was pregnant. Pt is G1P1A0, and is on depo. Pt is sexually active, and uses condoms most of the time, but not all the time. No alleviating factors noted. Pt denies fever, chills.    Past Medical History:  Diagnosis Date  . Anemia   . Asthma   . Gonorrhea affecting pregnancy in second trimester 01/20/2016   Had positive GC culture on 09/25/15, treated. Negative TOC on 12/06/15.     Patient Active Problem List   Diagnosis Date Noted  . Active labor at term 01/20/2016  . Short cervix in second trimester, antepartum 01/20/2016  . Gonorrhea affecting pregnancy in second trimester 01/20/2016  . Supervision of normal pregnancy in third trimester 01/16/2016  . GBS (group B Streptococcus carrier), +RV culture, currently pregnant 01/16/2016  .  Anemia, iron deficiency   . Asthma, mild intermittent, well-controlled 09/07/2013    Past Surgical History:  Procedure Laterality Date  . WISDOM TOOTH EXTRACTION      OB History    Gravida Para Term Preterm AB Living   1 1 1  0 0 1   SAB TAB Ectopic Multiple Live Births   0 0 0 0 1       Home Medications    Prior to Admission medications   Medication Sig Start Date End Date Taking? Authorizing Provider  albuterol (PROVENTIL HFA;VENTOLIN HFA) 108 (90 Base) MCG/ACT inhaler Inhale 2 puffs into the lungs every 4 (four) hours as needed for wheezing. 12/14/15   [name], CNM  ibuprofen (ADVIL,MOTRIN) 600 MG tablet Take 1 tablet (600 mg total) by mouth every 6 (six) hours. 01/22/16   Montez Morita, CNM  medroxyPROGESTERone (DEPO-PROVERA) 150 MG/ML injection Inject 1 mL (150 mg total) into the muscle every 3 (three) months. 02/01/16   Brock Bad, MD  norethindrone (MICRONOR,CAMILA,ERRIN) 0.35 MG tablet Take 1 tablet (0.35 mg total) by mouth daily. 04/11/16   Brock Bad, MD  Prenat-Fe Poly-Methfol-FA-DHA (VITAFOL ULTRA) 29-0.6-0.4-200 MG CAPS Take 1 capsule by mouth at bedtime.     Historical Provider, MD    Family History Family History  Problem Relation Age of Onset  . Hypertension Father   . Diabetes Paternal Grandmother   . Hypertension Paternal Grandmother   . Hypertension Paternal Grandfather   . Cystic fibrosis Paternal Grandfather   .  Diabetes Mellitus II Paternal Grandfather   . Cancer Neg Hx     Social History Social History  Substance Use Topics  . Smoking status: Never Smoker  . Smokeless tobacco: Never Used  . Alcohol use No     Allergies   Patient has no known allergies.   Review of Systems Review of Systems  Constitutional: Negative for chills and fever.  HENT: Positive for congestion, ear pain, rhinorrhea, sneezing and sore throat.   Respiratory: Positive for cough.   Gastrointestinal: Positive for vomiting.  All other systems  reviewed and are negative.    Physical Exam Updated Vital Signs BP 132/81 (BP Location: Left Arm)   Pulse 110   Temp 98.2 F (36.8 C) (Oral)   Resp 17   Wt 129 lb 4 oz (58.6 kg)   SpO2 100%   BMI 25.24 kg/m   Physical Exam  Constitutional: She is oriented to person, place, and time. She appears well-developed and well-nourished. No distress.  HENT:  Head: Normocephalic and atraumatic.  Mouth/Throat: No oropharyngeal exudate.  TMs clear, mild erythema to posterior oropharynx   Eyes: Conjunctivae and EOM are normal. Pupils are equal, round, and reactive to light.  Neck: Normal range of motion. Neck supple.  Cardiovascular: Normal rate, regular rhythm, normal heart sounds and intact distal pulses.   No murmur heard. Pulmonary/Chest: Effort normal and breath sounds normal. No respiratory distress.  Abdominal: Soft. There is no tenderness. There is no rebound and no guarding.  Musculoskeletal: Normal range of motion. She exhibits no edema or tenderness.  Lymphadenopathy:    She has no cervical adenopathy.  Neurological: She is alert and oriented to person, place, and time. No cranial nerve deficit. She exhibits normal muscle tone. Coordination normal.  Skin: Skin is warm.  Psychiatric: She has a normal mood and affect. Her behavior is normal.  Nursing note and vitals reviewed.    ED Treatments / Results  DIAGNOSTIC STUDIES:  Oxygen Saturation is 100% on RA, normal by my interpretation.    COORDINATION OF CARE:  1:09 PM Discussed treatment plan with pt at bedside and pt agreed to plan.   Labs (all labs ordered are listed, but only abnormal results are displayed) Labs Reviewed  RAPID STRE[facility] SCREEN (NOT [facility])  CULTURE, GROUP A STREP ([facility])  POC URINE PREG, ED    EKG  EKG Interpretation None       Radiology No results found.  Procedures Procedures (including critical care time)  Medications Ordered in ED Medications - No data to display   Initial  Impression / Assessment and Plan / ED Course  Pt symptoms consistent with URI and viral pharyngitis. Pt will be discharged with symptomatic treatment.  Discussed return precautions.  Follow-up with PCP. Pt is hemodynamically stable & in NAD prior to discharge.  I have reviewed the triage vital signs and the nursing notes.  Pertinent labs & imaging results that were available during my care of the patient were reviewed by me and considered in my medical decision making (see chart for details).  Clinical Course       Final Clinical Impressions(s) / ED Diagnoses   Final diagnoses:  Viral URI with cough    New Prescriptions New Prescriptions   No medications on file   I personally performed the services described in this documentation, which was scribed in my presence. The recorded information has been reviewed and is accurate.    Felicie Mornavid Iyahna Obriant, NP 05/07/16 1521    Canary Brimhristopher J  Tegeler, MD 05/08/16 [phone]

## 2016-05-07 NOTE — Discharge Instructions (Signed)
Please check with the pharmacist before taking any over the counter medication to insure it is safe when breastfeeding.

## 2016-05-07 NOTE — ED Triage Notes (Signed)
Patient c/o bilat ear pain, sore throat, nasal congestion, cough that is dry x several days.  Patient would like a pregnancy test because she feels that she wants to know for sure even thought she is on depo and had two negative tests at home.

## 2016-05-09 LAB — CULTURE, GROUP A STREP (THRC)

## 2016-05-21 ENCOUNTER — Ambulatory Visit: Payer: Self-pay

## 2016-05-23 ENCOUNTER — Ambulatory Visit: Payer: Medicaid Other | Admitting: *Deleted

## 2016-05-23 VITALS — BP 148/74 | HR 108 | Wt 133.9 lb

## 2016-05-23 DIAGNOSIS — Z3042 Encounter for surveillance of injectable contraceptive: Secondary | ICD-10-CM

## 2016-08-12 ENCOUNTER — Telehealth: Payer: Self-pay

## 2016-08-12 NOTE — Telephone Encounter (Signed)
Returned call, no answer, left vm 

## 2016-08-14 ENCOUNTER — Ambulatory Visit (INDEPENDENT_AMBULATORY_CARE_PROVIDER_SITE_OTHER): Payer: PRIVATE HEALTH INSURANCE

## 2016-08-14 ENCOUNTER — Other Ambulatory Visit (HOSPITAL_COMMUNITY)
Admission: RE | Admit: 2016-08-14 | Discharge: 2016-08-14 | Disposition: A | Discharge status: Home / Self Care | Payer: 59 | Source: Ambulatory Visit | Attending: Obstetrics & Gynecology | Admitting: Obstetrics & Gynecology

## 2016-08-14 VITALS — BP 139/83 | HR 86 | Wt 131.9 lb

## 2016-08-14 DIAGNOSIS — N898 Other specified noninflammatory disorders of vagina: Secondary | ICD-10-CM | POA: Diagnosis not present

## 2016-08-14 DIAGNOSIS — Z3042 Encounter for surveillance of injectable contraceptive: Secondary | ICD-10-CM | POA: Diagnosis not present

## 2016-08-14 LAB — POCT URINE PREGNANCY: Preg Test, Ur: NEGATIVE

## 2016-08-14 NOTE — Progress Notes (Addendum)
Patient presents with complaints of vaginal irritation and discharge. Patient self swab after DEPO Shot.

## 2016-08-14 NOTE — Progress Notes (Signed)
Patient presents for DEPO. UPT-NEGATIVE. Injection administered in RUOQ. Administrations This Visit    medroxyPROGESTERone (DEPO-PROVERA) injection 150 mg    Admin Date 08/14/2016 Action Given Dose 150 mg Route Intramuscular Administered By Maretta Beesarol J Angele Wiemann, RMA

## 2016-08-14 NOTE — Addendum Note (Signed)
Addended by: Maretta BeesMCGLASHAN, Zephaniah Enyeart J on: 08/14/2016 11:34 AM   Modules accepted: Orders, Level of Service

## 2016-08-15 LAB — CERVICOVAGINAL ANCILLARY ONLY
BACTERIAL VAGINITIS: POSITIVE — AB
Candida vaginitis: NEGATIVE
Chlamydia: NEGATIVE
NEISSERIA GONORRHEA: NEGATIVE
Trichomonas: NEGATIVE

## 2016-08-16 ENCOUNTER — Telehealth: Payer: Self-pay

## 2016-08-16 MED ORDER — METRONIDAZOLE 500 MG PO TABS: 500.0000 mg | ORAL_TABLET | Freq: Two times a day (BID) | ORAL | 0 refills | Status: DC

## 2016-08-16 NOTE — Telephone Encounter (Signed)
Per Dr. Debroah LoopArnold, pt has BV and needs tx. Flagyl 500 mg po bid x 7 days.  LM [facility] [address].  If she has any questions to please give us a call.

## 2016-10-30 ENCOUNTER — Ambulatory Visit: Payer: Medicaid Other

## 2016-11-06 ENCOUNTER — Ambulatory Visit (INDEPENDENT_AMBULATORY_CARE_PROVIDER_SITE_OTHER): Payer: PRIVATE HEALTH INSURANCE

## 2016-11-06 VITALS — BP 136/79 | HR 105 | Wt 133.9 lb

## 2016-11-06 DIAGNOSIS — Z3042 Encounter for surveillance of injectable contraceptive: Secondary | ICD-10-CM

## 2016-11-06 MED ORDER — MEDROXYPROGESTERONE ACETATE 150 MG/ML IM SUSP
150.0000 mg | Freq: Once | INTRAMUSCULAR | Status: AC
  Administered 2016-11-06: 150 mg via INTRAMUSCULAR

## 2016-11-06 NOTE — Progress Notes (Signed)
Patient presents for DEPO. Given in RUOQ. Tolerated well. Next DEPO 8/1-15/2018.  Administrations This Visit    medroxyPROGESTERone (DEPO-PROVERA) injection 150 mg    Admin Date 11/06/2016 Action Given Dose 150 mg Route Intramuscular Administered By Maretta BeesMcGlashan, Marck Mcclenny J, RMA

## 2017-01-20 ENCOUNTER — Other Ambulatory Visit: Payer: Self-pay | Admitting: Obstetrics

## 2017-01-20 DIAGNOSIS — Z30013 Encounter for initial prescription of injectable contraceptive: Secondary | ICD-10-CM

## 2017-01-21 ENCOUNTER — Telehealth: Payer: Self-pay

## 2017-01-21 NOTE — Telephone Encounter (Signed)
Pt called about depo refills. Pt advised that this was sent to her pharmacy yesterday with 3 refills

## 2017-01-22 ENCOUNTER — Ambulatory Visit: Payer: Medicaid Other

## 2017-01-27 ENCOUNTER — Ambulatory Visit: Payer: Medicaid Other

## 2017-01-28 ENCOUNTER — Ambulatory Visit (INDEPENDENT_AMBULATORY_CARE_PROVIDER_SITE_OTHER): Payer: Medicaid Other

## 2017-01-28 DIAGNOSIS — Z3042 Encounter for surveillance of injectable contraceptive: Secondary | ICD-10-CM

## 2017-01-28 MED ORDER — MEDROXYPROGESTERONE ACETATE 150 MG/ML IM SUSP
150.0000 mg | Freq: Once | INTRAMUSCULAR | Status: AC
  Administered 2017-01-28: 150 mg via INTRAMUSCULAR

## 2017-01-28 NOTE — Progress Notes (Signed)
Pt here for a depo shot. Pt due for next depo 04/21/17

## 2017-04-24 ENCOUNTER — Encounter: Payer: Self-pay | Admitting: Obstetrics & Gynecology

## 2017-04-24 ENCOUNTER — Ambulatory Visit (INDEPENDENT_AMBULATORY_CARE_PROVIDER_SITE_OTHER): Payer: Medicaid Other | Admitting: Obstetrics & Gynecology

## 2017-04-24 ENCOUNTER — Ambulatory Visit: Payer: Self-pay

## 2017-04-24 ENCOUNTER — Other Ambulatory Visit (HOSPITAL_COMMUNITY)
Admission: RE | Admit: 2017-04-24 | Discharge: 2017-04-24 | Disposition: A | Discharge status: Home / Self Care | Payer: Medicaid Other | Source: Ambulatory Visit | Attending: Obstetrics | Admitting: Obstetrics

## 2017-04-24 VITALS — BP 125/76 | HR 87 | Ht 61.0 in | Wt 135.8 lb

## 2017-04-24 DIAGNOSIS — N898 Other specified noninflammatory disorders of vagina: Secondary | ICD-10-CM | POA: Diagnosis not present

## 2017-04-24 DIAGNOSIS — N926 Irregular menstruation, unspecified: Secondary | ICD-10-CM

## 2017-04-24 DIAGNOSIS — Z0000 Encounter for general adult medical examination without abnormal findings: Secondary | ICD-10-CM

## 2017-04-24 DIAGNOSIS — Z01419 Encounter for gynecological examination (general) (routine) without abnormal findings: Secondary | ICD-10-CM | POA: Diagnosis not present

## 2017-04-24 DIAGNOSIS — Z3042 Encounter for surveillance of injectable contraceptive: Secondary | ICD-10-CM | POA: Diagnosis not present

## 2017-04-24 MED ORDER — MEDROXYPROGESTERONE ACETATE 150 MG/ML IM SUSP
150.0000 mg | Freq: Once | INTRAMUSCULAR | Status: AC
  Administered 2017-04-24: 150 mg via INTRAMUSCULAR

## 2017-04-24 NOTE — Progress Notes (Signed)
Patient is in the office for annual, pt wants std testing.

## 2017-04-24 NOTE — Progress Notes (Signed)
Subjective:    Catherine Golden is a 21 y.o. SAA G1 (21 yo son) female who presents for an annual exam. The patient has no complaints today. She has noted some vaginal odor, has been having spotting/bleeding like a light period for 3 months. The patient is sexually active. GYN screening history: no prior history of gyn screening tests. The patient wears seatbelts: yes. The patient participates in regular exercise: yes. Has the patient ever been transfused or tattooed?: no. The patient reports that there is not domestic violence in her life.   Menstrual History: OB History    Gravida Para Term Preterm AB Living   1 1 1  0 0 1   SAB TAB Ectopic Multiple Live Births   0 0 0 0 1      Menarche age: [age] No LMP recorded. Patient has had an injection.    The following portions of the patient's history were reviewed and updated as appropriate: allergies, current medications, past family history, past medical history, past social history, past surgical history and problem list.  Review of Systems Pertinent items are noted in HPI.   Working part time at [employer]- no breast/gyn/colon cancer She declines a flu vaccine Monogamous for 2 years, lives with her son and her parents    Objective:    BP 125/76   Pulse 87   Ht 5\' 1"  (1.549 m)   Wt 135 lb 12.8 oz (61.6 kg)   Breastfeeding? No   BMI 25.66 kg/m   General Appearance:    Alert, cooperative, no distress, appears stated age  Head:    Normocephalic, without obvious abnormality, atraumatic  Eyes:    PERRL, conjunctiva/corneas clear, EOM's intact, fundi    benign, both eyes  Ears:    Normal TM's and external ear canals, both ears  Nose:   Nares normal, septum midline, mucosa normal, no drainage    or sinus tenderness  Throat:   Lips, mucosa, and tongue normal; teeth and gums normal  Neck:   Supple, symmetrical, trachea midline, no adenopathy;    thyroid:  no enlargement/tenderness/nodules; no carotid   bruit or JVD  Back:      Symmetric, no curvature, ROM normal, no CVA tenderness  Lungs:     Clear to auscultation bilaterally, respirations unlabored  Chest Wall:    No tenderness or deformity   Heart:    Regular rate and rhythm, S1 and S2 normal, no murmur, rub   or gallop  Breast Exam:    No tenderness, masses, or nipple abnormality  Abdomen:     Soft, non-tender, bowel sounds active all four quadrants,    no masses, no organomegaly  Genitalia:    Normal female without lesion, discharge or tenderness, vaginal discharge is scant, no odor noted, no blood seen      Extremities:   Extremities normal, atraumatic, no cyanosis or edema  Pulses:   2+ and symmetric all extremities  Skin:   Skin color, texture, turgor normal, no rashes or lesions  Lymph nodes:   Cervical, supraclavicular, and axillary nodes normal  Neurologic:   CNII-XII intact, normal strength, sensation and reflexes    throughout  .    Assessment:    Healthy female exam.   Vaginal odor   Plan:     check CBC, [place]  , CBC. She wants 1 more depo shot, but if bleeding continues heavily, then she wants to try OCPs. Wet prep sent Information on Gardasil given, vaccine rec'd

## 2017-04-24 NOTE — Addendum Note (Signed)
Addended by: Natale MilchSTALLING, Sarin [place] D on: 04/24/2017 10:34 AM   Modules accepted: Orders

## 2017-04-25 LAB — HEPATITIS B SURFACE ANTIGEN: Hepatitis B Surface Ag: NEGATIVE

## 2017-04-25 LAB — CERVICOVAGINAL ANCILLARY ONLY
Bacterial vaginitis: POSITIVE — AB
CHLAMYDIA, DNA PROBE: NEGATIVE
Candida vaginitis: NEGATIVE
NEISSERIA GONORRHEA: NEGATIVE
Trichomonas: NEGATIVE

## 2017-04-25 LAB — HIV ANTIBODY (ROUTINE TESTING W REFLEX): HIV Screen 4th Generation wRfx: NONREACTIVE

## 2017-04-25 LAB — CBC
Hematocrit: 41.9 % (ref 34.0–46.6)
Hemoglobin: 14.1 g/dL (ref 11.1–15.9)
MCH: 27.9 pg (ref 26.6–33.0)
MCHC: 33.7 g/dL (ref 31.5–35.7)
MCV: 83 fL (ref 79–97)
Platelets: 326 10*3/uL (ref 150–379)
RBC: 5.06 x10E6/uL (ref 3.77–5.28)
RDW: 14.2 % (ref 12.3–15.4)
WBC: 4.6 10*3/uL (ref 3.4–10.8)

## 2017-04-25 LAB — HEPATITIS C ANTIBODY: Hep C Virus Ab: <0.1 s/co ratio (ref 0.0–0.9)

## 2017-04-25 LAB — TSH: TSH: 1.67 u[IU]/mL (ref 0.450–4.500)

## 2017-04-25 LAB — RPR: RPR Ser Ql: NONREACTIVE

## 2017-04-26 LAB — VON WILLEBRAND PANEL
FACTOR VIII ACTIVITY: 115 % (ref 57–163)
VON WILLEBRAND AG: 118 % (ref 50–200)
VON WILLEBRAND FACTOR: 93 % (ref 50–200)

## 2017-04-26 LAB — COAG STUDIES INTERP REPORT

## 2017-04-30 ENCOUNTER — Other Ambulatory Visit: Payer: Self-pay | Admitting: *Deleted

## 2017-04-30 DIAGNOSIS — N760 Acute vaginitis: Principal | ICD-10-CM

## 2017-04-30 DIAGNOSIS — B9689 Other specified bacterial agents as the cause of diseases classified elsewhere: Secondary | ICD-10-CM

## 2017-04-30 MED ORDER — METRONIDAZOLE 500 MG PO TABS: 500.0000 mg | ORAL_TABLET | Freq: Two times a day (BID) | ORAL | 0 refills | Status: DC

## 2017-04-30 NOTE — Progress Notes (Signed)
Flagyl ordered per Dr Marice Potterove. See lab note.

## 2017-05-09 ENCOUNTER — Emergency Department (HOSPITAL_COMMUNITY)
Admission: EM | Admit: 2017-05-09 | Discharge: 2017-05-09 | Disposition: A | Discharge status: Home / Self Care | Payer: Medicaid Other | Attending: Emergency Medicine | Admitting: Emergency Medicine

## 2017-05-09 ENCOUNTER — Encounter (HOSPITAL_COMMUNITY): Payer: Self-pay | Admitting: Nurse Practitioner

## 2017-05-09 DIAGNOSIS — J45909 Unspecified asthma, uncomplicated: Secondary | ICD-10-CM | POA: Diagnosis not present

## 2017-05-09 DIAGNOSIS — Z79899 Other long term (current) drug therapy: Secondary | ICD-10-CM | POA: Insufficient documentation

## 2017-05-09 DIAGNOSIS — J029 Acute pharyngitis, unspecified: Secondary | ICD-10-CM | POA: Diagnosis not present

## 2017-05-09 NOTE — ED Triage Notes (Signed)
Pt is c/o sore throat. States about a week ago she shared a drink with a friend who tested postive for strep throat.

## 2017-05-09 NOTE — ED Provider Notes (Signed)
[facility]-EMERGENCY DEPT Provider Note   CSN: [id number] Arrival date & time: 05/09/17  1744     History   Chief Complaint Chief Complaint  Patient presents with  . Sore Throat    HPI Catherine Golden is a 21 y.o. female. Chief complaint is sore throat.  HPI: [age] year old female with a sore throat. This is minimal. She relates it to a cough she's had. She was informed by a friend that friend was diagnosed with strep last week. She remembers hearing a glass with her. She is concerned because "I have a daughter". No fever. No chills. No swollen lymph nodes. No other symptoms.  Past Medical History:  Diagnosis Date  . Anemia   . Asthma   . Gonorrhea affecting pregnancy in second trimester 01/20/2016   Had positive GC culture on 09/25/15, treated. Negative TOC on 12/06/15.     Patient Active Problem List   Diagnosis Date Noted  . Anemia, iron deficiency   . Asthma, mild intermittent, well-controlled 09/07/2013    Past Surgical History:  Procedure Laterality Date  . WISDOM TOOTH EXTRACTION      OB History    Gravida Para Term Preterm AB Living   1 1 1  0 0 1   SAB TAB Ectopic Multiple Live Births   0 0 0 0 1       Home Medications    Prior to Admission medications   Medication Sig Start Date End Date Taking? Authorizing Provider  albuterol (PROVENTIL HFA;VENTOLIN HFA) 108 (90 Base) MCG/ACT inhaler Inhale 2 puffs into the lungs every 4 (four) hours as needed for wheezing. Patient not taking: Reported on 04/24/2017 12/14/15   Orvilla Cornwallenney, Rachelle A, CNM  ibuprofen (ADVIL,MOTRIN) 600 MG tablet Take 1 tablet (600 mg total) by mouth every 6 (six) hours. Patient not taking: Reported on 04/24/2017 01/22/16   Montez MoritaLawson, Marie D, CNM  MedroxyPROGESTERone Acetate 150 MG/ML SUSY INJECT CONTENTS OF ONE SYRINGE INTRAMUSCULARLY ONCE EVERY 3 MONTHS 01/20/17   Brock BadHarper, Charles A, MD  metroNIDAZOLE (FLAGYL) 500 MG tablet Take 1 tablet (500 mg total) 2 (two) times daily by  mouth. 04/30/17   Allie Bossierove, Myra C, MD  norethindrone (MICRONOR,CAMILA,ERRIN) 0.35 MG tablet Take 1 tablet (0.35 mg total) by mouth daily. Patient not taking: Reported on 11/06/2016 04/11/16   Brock BadHarper, Charles A, MD  Prenat-Fe Poly-Methfol-FA-DHA (VITAFOL ULTRA) 29-0.6-0.4-200 MG CAPS Take 1 capsule by mouth at bedtime.     [provider]    Family History Family History  Problem Relation Age of Onset  . Hypertension Father   . Diabetes Paternal Grandmother   . Hypertension Paternal Grandmother   . Stroke Paternal Grandmother   . Hypertension Paternal Grandfather   . Cystic fibrosis Paternal Grandfather   . Diabetes Mellitus II Paternal Grandfather   . Cancer Neg Hx     Social History Social History   Tobacco Use  . Smoking status: Never Smoker  . Smokeless tobacco: Never Used  Substance Use Topics  . Alcohol use: No    Alcohol/week: 0.0 oz  . Drug use: No     Allergies   Patient has no known allergies.   Review of Systems Review of Systems  Constitutional: Negative for appetite change, chills, diaphoresis, fatigue and fever.  HENT: Positive for sore throat. Negative for mouth sores and trouble swallowing.   Eyes: Negative for visual disturbance.  Respiratory: Negative for cough, chest tightness, shortness of breath and wheezing.   Cardiovascular: Negative for chest pain.  Gastrointestinal: Negative for abdominal distention, abdominal pain, diarrhea, nausea and vomiting.  Endocrine: Negative for polydipsia, polyphagia and polyuria.  Genitourinary: Negative for dysuria, frequency and hematuria.  Musculoskeletal: Negative for gait problem.  Skin: Negative for color change, pallor and rash.  Neurological: Negative for dizziness, syncope, light-headedness and headaches.  Hematological: Does not bruise/bleed easily.  Psychiatric/Behavioral: Negative for behavioral problems and confusion.     Physical Exam Updated Vital Signs BP 134/73 (BP Location: Left Arm)    Pulse 89   Temp 98.6 F (37 C) (Oral)   Resp 18   SpO2 100%   Physical Exam  Constitutional: She is oriented to person, place, and time. She appears well-developed and well-nourished. No distress.  HENT:  Head: Normocephalic.  Normal pharynx. No exudate. No petechiae. Normal tone. Afebrile. No adenopathy in the neck anteriorly or posteriorly. Centor score of 0  Eyes: Conjunctivae are normal. Pupils are equal, round, and reactive to light. No scleral icterus.  Neck: Normal range of motion. Neck supple. No thyromegaly present.  Cardiovascular: Normal rate and regular rhythm. Exam reveals no gallop and no friction rub.  No murmur heard. Pulmonary/Chest: Effort normal and breath sounds normal. No respiratory distress. She has no wheezes. She has no rales.  Abdominal: Soft. Bowel sounds are normal. She exhibits no distension. There is no tenderness. There is no rebound.  Musculoskeletal: Normal range of motion.  Neurological: She is alert and oriented to person, place, and time.  Skin: Skin is warm and dry. No rash noted.  Psychiatric: She has a normal mood and affect. Her behavior is normal.     ED Treatments / Results  Labs (all labs ordered are listed, but only abnormal results are displayed) Labs Reviewed - No data to display  EKG  EKG Interpretation None       Radiology No results found.  Procedures Procedures (including critical care time)  Medications Ordered in ED Medications - No data to display   Initial Impression / Assessment and Plan / ED Course  I have reviewed the triage vital signs and the nursing notes.  Pertinent labs & imaging results that were available during my care of the patient were reviewed by me and considered in my medical decision making (see chart for details).    Medication for testing or treatment. Centor score 0  Final Clinical Impressions(s) / ED Diagnoses   Final diagnoses:  Viral pharyngitis    ED Discharge Orders    None         Rolland PorterJames, Yashas Camilli, MD 05/09/17 [phone]

## 2017-05-09 NOTE — Discharge Instructions (Signed)
Sooner Tylenol for pain.   Saltwater gargle and spit for pain and imflammation

## 2017-07-03 ENCOUNTER — Telehealth: Payer: Self-pay

## 2017-07-03 NOTE — Telephone Encounter (Signed)
Returned call and pt stated that she is currently on depo and wants to change to pills due to irregular bleeding. S/w scheduler and changed pt appt type to see provider on 07-17-17.

## 2017-07-17 ENCOUNTER — Ambulatory Visit (INDEPENDENT_AMBULATORY_CARE_PROVIDER_SITE_OTHER): Payer: Medicaid Other | Admitting: Obstetrics

## 2017-07-17 ENCOUNTER — Encounter: Payer: Self-pay | Admitting: Obstetrics

## 2017-07-17 ENCOUNTER — Telehealth: Payer: Self-pay | Admitting: *Deleted

## 2017-07-17 VITALS — BP 129/82 | HR 88 | Ht 61.0 in | Wt 136.2 lb

## 2017-07-17 DIAGNOSIS — N939 Abnormal uterine and vaginal bleeding, unspecified: Secondary | ICD-10-CM

## 2017-07-17 DIAGNOSIS — Z309 Encounter for contraceptive management, unspecified: Secondary | ICD-10-CM

## 2017-07-17 DIAGNOSIS — Z3009 Encounter for other general counseling and advice on contraception: Secondary | ICD-10-CM

## 2017-07-17 DIAGNOSIS — Z30011 Encounter for initial prescription of contraceptive pills: Secondary | ICD-10-CM

## 2017-07-17 MED ORDER — LEVONORGESTREL-ETHINYL ESTRAD 0.15-30 MG-MCG PO TABS: 1.0000 | ORAL_TABLET | Freq: Every day | ORAL | 11 refills | Status: DC

## 2017-07-17 NOTE — Patient Instructions (Addendum)
Oral Contraception Information Oral contraceptive pills (OCPs) are medicines taken to prevent pregnancy. OCPs work by preventing the ovaries from releasing eggs. The hormones in OCPs also cause the cervical mucus to thicken, preventing the sperm from entering the uterus. The hormones also cause the uterine lining to become thin, not allowing a fertilized egg to attach to the inside of the uterus. OCPs are highly effective when taken exactly as prescribed. However, OCPs do not prevent sexually transmitted diseases (STDs). Safe sex practices, such as using condoms along with the pill, can help prevent STDs. Before taking the pill, you may have a physical exam and Pap test. Your health care provider may order blood tests. The health care provider will make sure you are a good candidate for oral contraception. Discuss with your health care provider the possible side effects of the OCP you may be prescribed. When starting an OCP, it can take 2 to 3 months for the body to adjust to the changes in hormone levels in your body. Types of oral contraception  The combination pill-This pill contains estrogen and progestin (synthetic progesterone) hormones. The combination pill comes in 21-day, 28-day, or 91-day packs. Some types of combination pills are meant to be taken continuously (365-day pills). With 21-day packs, you do not take pills for 7 days after the last pill. With 28-day packs, the pill is taken every day. The last 7 pills are without hormones. Certain types of pills have more than 21 hormone-containing pills. With 91-day packs, the first 84 pills contain both hormones, and the last 7 pills contain no hormones or contain estrogen only.  The minipill-This pill contains the progesterone hormone only. The pill is taken every day continuously. It is very important to take the pill at the same time each day. The minipill comes in packs of 28 pills. All 28 pills contain the hormone. Advantages of oral  contraceptive pills  Decreases premenstrual symptoms.  Treats menstrual period cramps.  Regulates the menstrual cycle.  Decreases a heavy menstrual flow.  May treatacne, depending on the type of pill.  Treats abnormal uterine bleeding.  Treats polycystic ovarian syndrome.  Treats endometriosis.  Can be used as emergency contraception. Things that can make oral contraceptive pills less effective OCPs can be less effective if:  You forget to take the pill at the same time every day.  You have a stomach or intestinal disease that lessens the absorption of the pill.  You take OCPs with other medicines that make OCPs less effective, such as antibiotics, certain HIV medicines, and some seizure medicines.  You take expired OCPs.  You forget to restart the pill on day 7, when using the packs of 21 pills.  Risks associated with oral contraceptive pills Oral contraceptive pills can sometimes cause side effects, such as:  Headache.  Nausea.  Breast tenderness.  Irregular bleeding or spotting.  Combination pills are also associated with a small increased risk of:  Blood clots.  Heart attack.  Stroke.  This information is not intended to replace advice given to you by your health care provider. Make sure you discuss any questions you have with your health care provider. Document Released: 08/31/2002 Document Revised: 11/16/2015 Document Reviewed: 11/29/2012 Elsevier Interactive Patient Education  2018 [employer]  Oral Contraception Use Oral contraceptive pills (OCPs) are medicines taken to prevent pregnancy. OCPs work by preventing the ovaries from releasing eggs. The hormones in OCPs also cause the cervical mucus to thicken, preventing the sperm from entering the uterus. The  hormones also cause the uterine lining to become thin, not allowing a fertilized egg to attach to the inside of the uterus. OCPs are highly effective when taken exactly as prescribed. However,  OCPs do not prevent sexually transmitted diseases (STDs). Safe sex practices, such as using condoms along with an OCP, can help prevent STDs. Before taking OCPs, you may have a physical exam and Pap test. Your health care provider may also order blood tests if necessary. Your health care provider will make sure you are a good candidate for oral contraception. Discuss with your health care provider the possible side effects of the OCP you may be prescribed. When starting an OCP, it can take 2 to 3 months for the body to adjust to the changes in hormone levels in your body. How to take oral contraceptive pills Your health care provider may advise you on how to start taking the first cycle of OCPs. Otherwise, you can:  Start on day 1 of your menstrual period. You will not need any backup contraceptive protection with this start time.  Start on the first Sunday after your menstrual period or the day you get your prescription. In these cases, you will need to use backup contraceptive protection for the first week.  Start the pill at any time of your cycle. If you take the pill within 5 days of the start of your period, you are protected against pregnancy right away. In this case, you will not need a backup form of birth control. If you start at any other time of your menstrual cycle, you will need to use another form of birth control for 7 days. If your OCP is the type called a minipill, it will protect you from pregnancy after taking it for 2 days (48 hours).  After you have started taking OCPs:  If you forget to take 1 pill, take it as soon as you remember. Take the next pill at the regular time.  If you miss 2 or more pills, call your health care provider because different pills have different instructions for missed doses. Use backup birth control until your next menstrual period starts.  If you use a 28-day pack that contains inactive pills and you miss 1 of the last 7 pills (pills with no  hormones), it will not matter. Throw away the rest of the non-hormone pills and start a new pill pack.  No matter which day you start the OCP, you will always start a new pack on that same day of the week. Have an extra pack of OCPs and a backup contraceptive method available in case you miss some pills or lose your OCP pack. Follow these instructions at home:  Do not smoke.  Always use a condom to protect against STDs. OCPs do not protect against STDs.  Use a calendar to mark your menstrual period days.  Read the information and directions that came with your OCP. Talk to your health care provider if you have questions. Contact a health care provider if:  You develop nausea and vomiting.  You have abnormal vaginal discharge or bleeding.  You develop a rash.  You miss your menstrual period.  You are losing your hair.  You need treatment for mood swings or depression.  You get dizzy when taking the OCP.  You develop acne from taking the OCP.  You become pregnant. Get help right away if:  You develop chest pain.  You develop shortness of breath.  You have an uncontrolled or  severe headache.  You develop numbness or slurred speech.  You develop visual problems.  You develop pain, redness, and swelling in the legs. This information is not intended to replace advice given to you by your health care provider. Make sure you discuss any questions you have with your health care provider. Document Released: 05/30/2011 Document Revised: 11/16/2015 Document Reviewed: 11/29/2012 Elsevier Interactive Patient Education  2017 Elsevier Inc.  

## 2017-07-17 NOTE — Progress Notes (Signed)
Presents for bleeding with DEPO, wants to change to OCP.  Last DEPO was 04/24/17.  Bleeding non stop since 05/2017

## 2017-07-17 NOTE — Progress Notes (Signed)
Subjective:    Catherine Golden is a 22 y.o. female who presents for contraception counseling. The patient has no complaints today. The patient is sexually active. Pertinent past medical history: none.  The information documented in the HPI was reviewed and verified.  Menstrual History: OB History    Gravida Para Term Preterm AB Living   1 1 1  0 0 1   SAB TAB Ectopic Multiple Live Births   0 0 0 0 1      Patient's last menstrual period was 05/28/2017 (approximate).   Patient Active Problem List   Diagnosis Date Noted  . Anemia, iron deficiency   . Asthma, mild intermittent, well-controlled 09/07/2013   Past Medical History:  Diagnosis Date  . Anemia   . Asthma   . Gonorrhea affecting pregnancy in second trimester 01/20/2016   Had positive GC culture on 09/25/15, treated. Negative TOC on 12/06/15.     Past Surgical History:  Procedure Laterality Date  . WISDOM TOOTH EXTRACTION       Current Outpatient Medications:  .  albuterol (PROVENTIL HFA;VENTOLIN HFA) 108 (90 Base) MCG/ACT inhaler, Inhale 2 puffs into the lungs every 4 (four) hours as needed for wheezing. (Patient not taking: Reported on 04/24/2017), Disp: 2 Inhaler, Rfl: 11 .  ibuprofen (ADVIL,MOTRIN) 600 MG tablet, Take 1 tablet (600 mg total) by mouth every 6 (six) hours. (Patient not taking: Reported on 04/24/2017), Disp: 30 tablet, Rfl: 0 .  levonorgestrel-ethinyl estradiol (NORDETTE) 0.15-30 MG-MCG tablet, Take 1 tablet by mouth daily., Disp: 1 Package, Rfl: 11 .  MedroxyPROGESTERone Acetate 150 MG/ML SUSY, INJECT CONTENTS OF ONE SYRINGE INTRAMUSCULARLY ONCE EVERY 3 MONTHS, Disp: 1 mL, Rfl: 3 .  metroNIDAZOLE (FLAGYL) 500 MG tablet, Take 1 tablet (500 mg total) 2 (two) times daily by mouth. (Patient not taking: Reported on 07/17/2017), Disp: 14 tablet, Rfl: 0 .  norethindrone (MICRONOR,CAMILA,ERRIN) 0.35 MG tablet, Take 1 tablet (0.35 mg total) by mouth daily. (Patient not taking: Reported on 11/06/2016), Disp: 1 Package,  Rfl: 11 .  Prenat-Fe Poly-Methfol-FA-DHA (VITAFOL ULTRA) 29-0.6-0.4-200 MG CAPS, Take 1 capsule by mouth at bedtime. , Disp: , Rfl:  No Known Allergies  Social History   Tobacco Use  . Smoking status: Never Smoker  . Smokeless tobacco: Never Used  Substance Use Topics  . Alcohol use: No    Alcohol/week: 0.0 oz    Family History  Problem Relation Age of Onset  . Hypertension Father   . Diabetes Paternal Grandmother   . Hypertension Paternal Grandmother   . Stroke Paternal Grandmother   . Hypertension Paternal Grandfather   . Cystic fibrosis Paternal Grandfather   . Diabetes Mellitus II Paternal Grandfather   . Cancer Neg Hx        Review of Systems Constitutional: negative for weight loss Genitourinary:negative for abnormal menstrual periods and vaginal discharge   Objective:   BP 129/82   Pulse 88   Ht 5\' 1"  (1.549 m)   Wt 136 lb 3.2 oz (61.8 kg)   LMP 05/28/2017 (Approximate)   BMI 25.73 kg/m    General:   alert  Skin:   no rash or abnormalities  Lungs:   clear to auscultation bilaterally  Heart:   regular rate and rhythm, S1, S2 normal, no murmur, click, rub or gallop  Breasts:   normal without suspicious masses, skin or nipple changes or axillary nodes  Abdomen:  normal findings: no organomegaly, soft, non-tender and no hernia  Pelvis:  External genitalia: normal general appearance Urinary  system: urethral meatus normal and bladder without fullness, nontender Vaginal: normal without tenderness, induration or masses Cervix: normal appearance Adnexa: normal bimanual exam Uterus: anteverted and non-tender, normal size   Lab Review Urine pregnancy test Labs reviewed yes Radiologic studies reviewed no  >50% of 10 min visit spent on counseling and coordination of care.    Assessment:    22 y.o., discontinuing Depo-Provera injections and starting OCP's, no contraindications.   1. Abnormal uterine bleeding (AUB) - not pleased with Depo  2. Encounter for  other general counseling and advice on contraception - wants to D/C Depo and start OCP's  3. Encounter for initial prescription of contraceptive pills Rx: - levonorgestrel-ethinyl estradiol (NORDETTE) 0.15-30 MG-MCG tablet; Take 1 tablet by mouth daily.  Dispense: 1 Package; Refill: 11   Plan:    All questions answered. Contraception: OCP (estrogen/progesterone). Discussed healthy lifestyle modifications. Agricultural engineer distributed. Follow up in 4 months.    Meds ordered this encounter  Medications  . levonorgestrel-ethinyl estradiol (NORDETTE) 0.15-30 MG-MCG tablet    Sig: Take 1 tablet by mouth daily.    Dispense:  1 Package    Refill:  11    Brock Bad MD

## 2017-07-17 NOTE — Telephone Encounter (Signed)
[facility] Questions/concerns discussed and answered.  Pt made aware she may call at any time if has other concerns re: BC.  Pt states understanding.

## 2017-08-29 ENCOUNTER — Telehealth: Payer: Self-pay | Admitting: Pediatrics

## 2017-08-29 NOTE — Telephone Encounter (Signed)
Called and left vm [facility].

## 2017-08-29 NOTE — Telephone Encounter (Signed)
-----   Message from [facility] G[facility] MD sent at 07/18/2017  3:50 PM EST ----- Please call family to encourage her to find a pcp at   [facility]  Address: [address], [place], [place] [address] Hours: Open ? Closes 12:30PM ? Reopens 1:30PM Phone: [phone]

## 2017-09-23 ENCOUNTER — Encounter: Payer: Self-pay | Admitting: Obstetrics

## 2017-09-23 ENCOUNTER — Other Ambulatory Visit (HOSPITAL_COMMUNITY)
Admission: RE | Admit: 2017-09-23 | Discharge: 2017-09-23 | Disposition: A | Discharge status: Home / Self Care | Payer: Medicaid Other | Source: Ambulatory Visit | Attending: Obstetrics | Admitting: Obstetrics

## 2017-09-23 ENCOUNTER — Ambulatory Visit (INDEPENDENT_AMBULATORY_CARE_PROVIDER_SITE_OTHER): Payer: Medicaid Other | Admitting: Obstetrics

## 2017-09-23 VITALS — BP 128/79 | HR 86 | Wt 137.2 lb

## 2017-09-23 DIAGNOSIS — Z01419 Encounter for gynecological examination (general) (routine) without abnormal findings: Secondary | ICD-10-CM | POA: Insufficient documentation

## 2017-09-23 DIAGNOSIS — B373 Candidiasis of vulva and vagina: Secondary | ICD-10-CM | POA: Insufficient documentation

## 2017-09-23 DIAGNOSIS — Z309 Encounter for contraceptive management, unspecified: Secondary | ICD-10-CM

## 2017-09-23 DIAGNOSIS — N898 Other specified noninflammatory disorders of vagina: Secondary | ICD-10-CM | POA: Diagnosis present

## 2017-09-23 DIAGNOSIS — Z3041 Encounter for surveillance of contraceptive pills: Secondary | ICD-10-CM

## 2017-09-23 MED ORDER — LEVONORGESTREL-ETHINYL ESTRAD 0.15-30 MG-MCG PO TABS: 1.0000 | ORAL_TABLET | Freq: Every day | ORAL | 11 refills | Status: DC

## 2017-09-23 MED ORDER — FLUCONAZOLE 150 MG PO TABS: 150.0000 mg | ORAL_TABLET | Freq: Once | ORAL | 0 refills | Status: AC

## 2017-09-23 NOTE — Progress Notes (Signed)
Patient ID: Catherine Golden, female   DOB: 01/20/1996, 22 y.o.   MRN: [id number]  Chief Complaint  Patient presents with  . GYN    HPI Catherine JuneShikia D Forness is a 22 y.o. female.  Clumpy, cottage cheese-like vaginal discharge with itching. HPI  Past Medical History:  Diagnosis Date  . Anemia   . Asthma   . Gonorrhea affecting pregnancy in second trimester 01/20/2016   Had positive GC culture on 09/25/15, treated. Negative TOC on 12/06/15.     Past Surgical History:  Procedure Laterality Date  . WISDOM TOOTH EXTRACTION      Family History  Problem Relation Age of Onset  . Hypertension Father   . Diabetes Paternal Grandmother   . Hypertension Paternal Grandmother   . Stroke Paternal Grandmother   . Hypertension Paternal Grandfather   . Cystic fibrosis Paternal Grandfather   . Diabetes Mellitus II Paternal Grandfather   . Cancer Neg Hx     Social History Social History   Tobacco Use  . Smoking status: Never Smoker  . Smokeless tobacco: Never Used  Substance Use Topics  . Alcohol use: No    Alcohol/week: 0.0 oz  . Drug use: No    No Known Allergies  Current Outpatient Medications  Medication Sig Dispense Refill  . levonorgestrel-ethinyl estradiol (NORDETTE) 0.15-30 MG-MCG tablet Take 1 tablet by mouth daily. 1 Package 11  . albuterol (PROVENTIL HFA;VENTOLIN HFA) 108 (90 Base) MCG/ACT inhaler Inhale 2 puffs into the lungs every 4 (four) hours as needed for wheezing. (Patient not taking: Reported on 04/24/2017) 2 Inhaler 11  . fluconazole (DIFLUCAN) 150 MG tablet Take 1 tablet (150 mg total) by mouth once for 1 dose. 1 tablet 0  . ibuprofen (ADVIL,MOTRIN) 600 MG tablet Take 1 tablet (600 mg total) by mouth every 6 (six) hours. (Patient not taking: Reported on 04/24/2017) 30 tablet 0  . MedroxyPROGESTERone Acetate 150 MG/ML SUSY INJECT CONTENTS OF ONE SYRINGE INTRAMUSCULARLY ONCE EVERY 3 MONTHS (Patient not taking: Reported on 09/23/2017) 1 mL 3  . metroNIDAZOLE (FLAGYL) 500  MG tablet Take 1 tablet (500 mg total) 2 (two) times daily by mouth. (Patient not taking: Reported on 07/17/2017) 14 tablet 0  . Prenat-Fe Poly-Methfol-FA-DHA (VITAFOL ULTRA) 29-0.6-0.4-200 MG CAPS Take 1 capsule by mouth at bedtime.      No current facility-administered medications for this visit.     Review of Systems Review of Systems Constitutional: negative for fatigue and weight loss Respiratory: negative for cough and wheezing Cardiovascular: negative for chest pain, fatigue and palpitations Gastrointestinal: negative for abdominal pain and change in bowel habits Genitourinary:positive for vaginal discharge with itching Integument/breast: negative for nipple discharge Musculoskeletal:negative for myalgias Neurological: negative for gait problems and tremors Behavioral/Psych: negative for abusive relationship, depression Endocrine: negative for temperature intolerance      Blood pressure 128/79, pulse 86, weight 137 lb 3.2 oz (62.2 kg), last menstrual period 09/22/2017, not currently breastfeeding.  Physical Exam Physical Exam           General:  Alert and no distress Abdomen:  normal findings: no organomegaly, soft, non-tender and no hernia  Pelvis:  External genitalia: normal general appearance Urinary system: urethral meatus normal and bladder without fullness, nontender Vaginal: normal without tenderness, induration or masses Cervix: normal appearance Adnexa: normal bimanual exam Uterus: anteverted and non-tender, normal size    50% of 15 min visit spent on counseling and coordination of care.   Data Reviewed Wet Prep Cultures  Assessment and Plan:  1. Encounter for routine gynecological examination with Papanicolaou smear of cervix Rx: - Cytology - PAP  2. Encounter for surveillance of contraceptive pills Rx: - levonorgestrel-ethinyl estradiol (NORDETTE) 0.15-30 MG-MCG tablet; Take 1 tablet by mouth daily.  Dispense: 1 Package; Refill: 11  3. Vaginal  discharge Rx: - fluconazole (DIFLUCAN) 150 MG tablet; Take 1 tablet (150 mg total) by mouth once for 1 dose.  Dispense: 1 tablet; Refill: 0 - Cervicovaginal ancillary only    Plan    Follow up in 1 year   No orders of the defined types were placed in this encounter.  Meds ordered this encounter  Medications  . fluconazole (DIFLUCAN) 150 MG tablet    Sig: Take 1 tablet (150 mg total) by mouth once for 1 dose.    Dispense:  1 tablet    Refill:  0  . levonorgestrel-ethinyl estradiol (NORDETTE) 0.15-30 MG-MCG tablet    Sig: Take 1 tablet by mouth daily.    Dispense:  1 Package    Refill:  11     Brock Bad MD 09-23-2017

## 2017-09-23 NOTE — Progress Notes (Signed)
Patient reports vaginal itching, thinks she may have yeast infection. Pt informed of FP insurance and aware of billing.

## 2017-09-24 ENCOUNTER — Encounter: Payer: Self-pay | Admitting: Obstetrics

## 2017-09-24 ENCOUNTER — Other Ambulatory Visit: Payer: Self-pay | Admitting: Obstetrics

## 2017-09-24 DIAGNOSIS — B3731 Acute candidiasis of vulva and vagina: Secondary | ICD-10-CM

## 2017-09-24 DIAGNOSIS — B373 Candidiasis of vulva and vagina: Secondary | ICD-10-CM

## 2017-09-24 LAB — CERVICOVAGINAL ANCILLARY ONLY
BACTERIAL VAGINITIS: NEGATIVE
Candida vaginitis: POSITIVE — AB
Chlamydia: NEGATIVE
NEISSERIA GONORRHEA: NEGATIVE
Trichomonas: NEGATIVE

## 2017-09-24 LAB — CYTOLOGY - PAP: Diagnosis: NEGATIVE

## 2017-09-24 MED ORDER — TERCONAZOLE 0.4 % VA CREA: 1.0000 | TOPICAL_CREAM | Freq: Every day | VAGINAL | 0 refills | Status: DC

## 2017-11-09 ENCOUNTER — Encounter (HOSPITAL_COMMUNITY): Payer: Self-pay | Admitting: Obstetrics and Gynecology

## 2017-11-09 ENCOUNTER — Other Ambulatory Visit: Payer: Self-pay

## 2017-11-09 ENCOUNTER — Emergency Department (HOSPITAL_COMMUNITY)
Admission: EM | Admit: 2017-11-09 | Discharge: 2017-11-10 | Disposition: A | Discharge status: Home / Self Care | Payer: Medicaid Other | Attending: Emergency Medicine | Admitting: Emergency Medicine

## 2017-11-09 DIAGNOSIS — Z79899 Other long term (current) drug therapy: Secondary | ICD-10-CM | POA: Insufficient documentation

## 2017-11-09 DIAGNOSIS — S0502XA Injury of conjunctiva and corneal abrasion without foreign body, left eye, initial encounter: Secondary | ICD-10-CM

## 2017-11-09 DIAGNOSIS — Y939 Activity, unspecified: Secondary | ICD-10-CM | POA: Insufficient documentation

## 2017-11-09 DIAGNOSIS — Y929 Unspecified place or not applicable: Secondary | ICD-10-CM | POA: Insufficient documentation

## 2017-11-09 DIAGNOSIS — H538 Other visual disturbances: Secondary | ICD-10-CM | POA: Insufficient documentation

## 2017-11-09 DIAGNOSIS — R51 Headache: Secondary | ICD-10-CM | POA: Insufficient documentation

## 2017-11-09 DIAGNOSIS — X58XXXA Exposure to other specified factors, initial encounter: Secondary | ICD-10-CM | POA: Insufficient documentation

## 2017-11-09 DIAGNOSIS — J4520 Mild intermittent asthma, uncomplicated: Secondary | ICD-10-CM | POA: Insufficient documentation

## 2017-11-09 DIAGNOSIS — Y999 Unspecified external cause status: Secondary | ICD-10-CM | POA: Insufficient documentation

## 2017-11-09 MED ORDER — TETRACAINE HCL 0.5 % OP SOLN
1.0000 [drp] | Freq: Once | OPHTHALMIC | Status: AC
  Administered 2017-11-09: 1 [drp] via OPHTHALMIC
  Filled 2017-11-09: qty 4

## 2017-11-09 MED ORDER — BUTALBITAL-APAP-CAFFEINE 50-325-40 MG PO TABS
1.0000 | ORAL_TABLET | Freq: Once | ORAL | Status: AC
  Administered 2017-11-09: 1 via ORAL
  Filled 2017-11-09: qty 1

## 2017-11-09 MED ORDER — FLUORESCEIN SODIUM 1 MG OP STRP
1.0000 | ORAL_STRIP | Freq: Once | OPHTHALMIC | Status: AC
  Administered 2017-11-09: 1 via OPHTHALMIC
  Filled 2017-11-09: qty 1

## 2017-11-09 MED ORDER — ERYTHROMYCIN 5 MG/GM OP OINT: TOPICAL_OINTMENT | OPHTHALMIC | 0 refills | Status: DC

## 2017-11-09 NOTE — ED Provider Notes (Signed)
[facility] Provider Note   CSN: [id number] Arrival date & time: 11/09/17  2103     History   Chief Complaint Chief Complaint  Patient presents with  . Eye Pain    HPI Catherine Golden is a 22 y.o. female.  22 yo F with a chief complaint of left eye pain and discharge.  This been going on for the past day or so.  Describes it is painful.  Having a left-sided headache as well.  She denies cough congestion or fever.  Denies sick contacts.  Denies foreign body.  Denies metalworking or welding.  She denies contact lens use.  Has some blurry vision that improves with blinking.  The history is provided by the patient.  Eye Pain  This is a new problem. The current episode started yesterday. The problem occurs constantly. The problem has not changed since onset.Pertinent negatives include no chest pain, no headaches and no shortness of breath. Nothing aggravates the symptoms. Nothing relieves the symptoms. She has tried nothing for the symptoms. The treatment provided no relief.    Past Medical History:  Diagnosis Date  . Anemia   . Asthma   . Gonorrhea affecting pregnancy in second trimester 01/20/2016   Had positive GC culture on 09/25/15, treated. Negative TOC on 12/06/15.     Patient Active Problem List   Diagnosis Date Noted  . Anemia, iron deficiency   . Asthma, mild intermittent, well-controlled 09/07/2013    Past Surgical History:  Procedure Laterality Date  . WISDOM TOOTH EXTRACTION       OB History    Gravida  1   Para  1   Term  1   Preterm  0   AB  0   Living  1     SAB  0   TAB  0   Ectopic  0   Multiple  0   Live Births  1            Home Medications    Prior to Admission medications   Medication Sig Start Date End Date Taking? Authorizing Provider  albuterol (PROVENTIL HFA;VENTOLIN HFA) 108 (90 Base) MCG/ACT inhaler Inhale 2 puffs into the lungs every 4 (four) hours as needed for wheezing. Patient  not taking: Reported on 04/24/2017 12/14/15   Orvilla Cornwall A, CNM  erythromycin ophthalmic ointment Place a 1/2 inch ribbon of ointment into the lower eyelid four times a day. 11/09/17   Melene Plan, DO  ibuprofen (ADVIL,MOTRIN) 600 MG tablet Take 1 tablet (600 mg total) by mouth every 6 (six) hours. Patient not taking: Reported on 04/24/2017 01/22/16   Montez Morita, CNM  levonorgestrel-ethinyl estradiol (NORDETTE) 0.15-30 MG-MCG tablet Take 1 tablet by mouth daily. 09/23/17   Brock Bad, MD  MedroxyPROGESTERone Acetate 150 MG/ML SUSY INJECT CONTENTS OF ONE SYRINGE INTRAMUSCULARLY ONCE EVERY 3 MONTHS Patient not taking: Reported on 09/23/2017 01/20/17   Brock Bad, MD  metroNIDAZOLE (FLAGYL) 500 MG tablet Take 1 tablet (500 mg total) 2 (two) times daily by mouth. Patient not taking: Reported on 07/17/2017 04/30/17   Allie Bossier, MD  Prenat-Fe Poly-Methfol-FA-DHA (VITAFOL ULTRA) 29-0.6-0.4-200 MG CAPS Take 1 capsule by mouth at bedtime.     [provider]  terconazole (TERAZOL 7) 0.4 % vaginal cream Place 1 applicator vaginally at bedtime. 09/24/17   Brock Bad, MD    Family History Family History  Problem Relation Age of Onset  . Hypertension Father   .  Diabetes Paternal Grandmother   . Hypertension Paternal Grandmother   . Stroke Paternal Grandmother   . Hypertension Paternal Grandfather   . Cystic fibrosis Paternal Grandfather   . Diabetes Mellitus II Paternal Grandfather   . Cancer Neg Hx     Social History Social History   Tobacco Use  . Smoking status: Never Smoker  . Smokeless tobacco: Never Used  Substance Use Topics  . Alcohol use: No    Alcohol/week: 0.0 oz  . Drug use: No     Allergies   Patient has no known allergies.   Review of Systems Review of Systems  Constitutional: Negative for chills and fever.  HENT: Negative for congestion and rhinorrhea.   Eyes: Positive for pain and discharge. Negative for redness and visual disturbance.    Respiratory: Negative for shortness of breath and wheezing.   Cardiovascular: Negative for chest pain and palpitations.  Gastrointestinal: Negative for nausea and vomiting.  Genitourinary: Negative for dysuria and urgency.  Musculoskeletal: Negative for arthralgias and myalgias.  Skin: Negative for pallor and wound.  Neurological: Negative for dizziness and headaches.     Physical Exam Updated Vital Signs BP 133/89 (BP Location: Left Arm)   Pulse 72   Temp 98.9 F (37.2 C) (Oral)   Resp 18   Ht  (1.549 m)   Wt 62.1 kg (137 lb)   LMP 11/02/2017 (Exact Date)   SpO2 100%   BMI 25.89 kg/m   Physical Exam  Constitutional: She is oriented to person, place, and time. She appears well-developed and well-nourished. No distress.  HENT:  Head: Normocephalic and atraumatic.  Eyes: Pupils are equal, round, and reactive to light. EOM are normal.  Slit lamp exam:      The right eye shows no corneal abrasion, no corneal flare, no corneal ulcer and no foreign body.       The left eye shows corneal abrasion. The left eye shows no corneal flare, no corneal ulcer and no foreign body.    Neck: Normal range of motion. Neck supple.  Cardiovascular: Normal rate and regular rhythm. Exam reveals no gallop and no friction rub.  No murmur heard. Pulmonary/Chest: Effort normal. She has no wheezes. She has no rales.  Abdominal: Soft. She exhibits no distension. There is no tenderness.  Musculoskeletal: She exhibits no edema or tenderness.  Neurological: She is alert and oriented to person, place, and time.  Skin: Skin is warm and dry. She is not diaphoretic.  Psychiatric: She has a normal mood and affect. Her behavior is normal.  Nursing note and vitals reviewed.    ED Treatments / Results  Labs (all labs ordered are listed, but only abnormal results are displayed) Labs Reviewed - No data to display  EKG None  Radiology No results found.  Procedures Procedures (including critical  care time)  Medications Ordered in ED Medications  tetracaine (PONTOCAINE) 0.5 % ophthalmic solution 1 drop (1 drop Left Eye Given 11/09/17 2331)  fluorescein ophthalmic strip 1 strip (1 strip Left Eye Given 11/09/17 2332)  butalbital-acetaminophen-caffeine (FIORICET, ESGIC) 50-325-40 MG per tablet 1 tablet (1 tablet Oral Given 11/09/17 2331)     Initial Impression / Assessment and Plan / ED Course  I have reviewed the triage vital signs and the nursing notes.  Pertinent labs & imaging results that were available during my care of the patient were reviewed by me and considered in my medical decision making (see chart for details).     22 yo F with  left eye pain.  Going on since yesterday.  She denies any injury.  Denies welding or working with metal.  Patient found to have a corneal abrasion.  Will give erythromycin ointment.  PCP follow-up.  11:46 PM:  I have discussed the diagnosis/risks/treatment options with the patient and family and believe the pt to be eligible for discharge home to follow-up with PCP. We also discussed returning to the ED immediately if new or worsening sx occur. We discussed the sx which are most concerning (e.g., sudden worsening pain, fever, inability to tolerate by mouth) that necessitate immediate return. Medications administered to the patient during their visit and any new prescriptions provided to the patient are listed below.  Medications given during this visit Medications  tetracaine (PONTOCAINE) 0.5 % ophthalmic solution 1 drop (1 drop Left Eye Given 11/09/17 2331)  fluorescein ophthalmic strip 1 strip (1 strip Left Eye Given 11/09/17 2332)  butalbital-acetaminophen-caffeine (FIORICET, ESGIC) 50-325-40 MG per tablet 1 tablet (1 tablet Oral Given 11/09/17 2331)      The patient appears reasonably screen and/or stabilized for discharge and I doubt any other medical condition or other [facility] requiring further screening, evaluation, or treatment in the ED at this  time prior to discharge.    Final Clinical Impressions(s) / ED Diagnoses   Final diagnoses:  Abrasion of left cornea, initial encounter    ED Discharge Orders        Ordered    erythromycin ophthalmic ointment     11/09/17 2336       Melene Plan, DO 11/09/17 2347

## 2017-11-09 NOTE — ED Triage Notes (Signed)
Pt reports blurred vision in her left eye and that the eye is watery. No foreign body noted in eye at this time. Pt's eye is red and slightly irritated looking and watery.  Pt reports she also feels like she has a tension headache and photosensitivity.

## 2017-11-09 NOTE — Discharge Instructions (Signed)
Take 4 over the counter ibuprofen tablets 3 times a day or 2 over-the-counter naproxen tablets twice a day for pain. Also take tylenol 1000mg(2 extra strength) four times a day.    

## 2017-12-03 IMAGING — US US MFM OB TRANSVAGINAL
1 series · 14 of 28 positions shown · non-contrast
Comparison: none

[Series 1: us mfm ob transvaginal · 47 acquisitions, 14 frames shown]
[im 2/47]
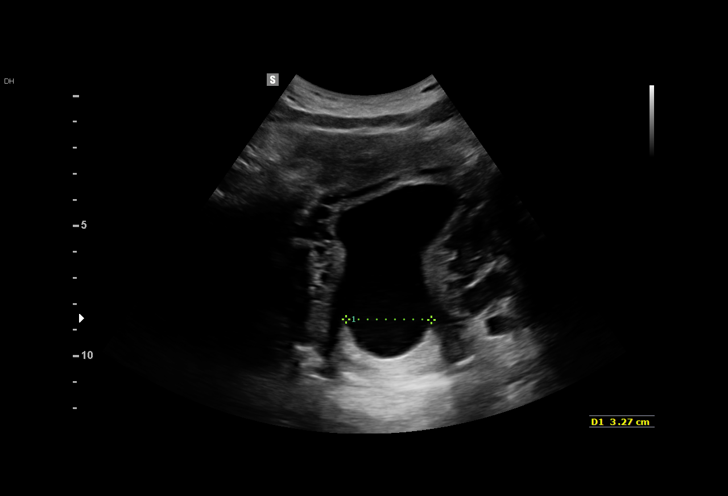
[im 6/47]
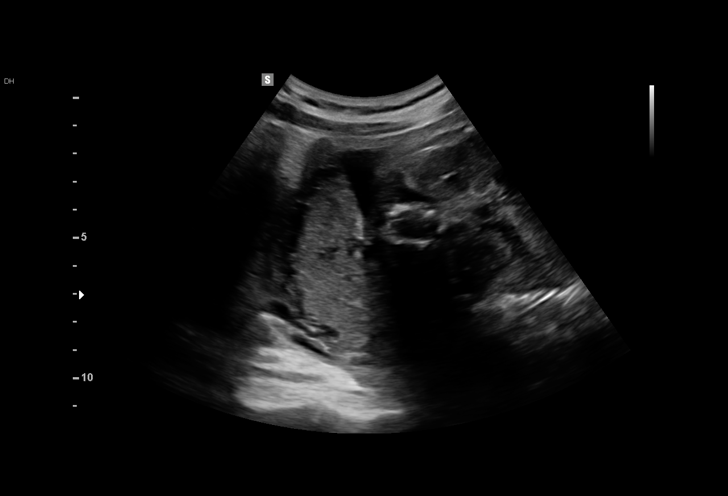
[im 9/47]
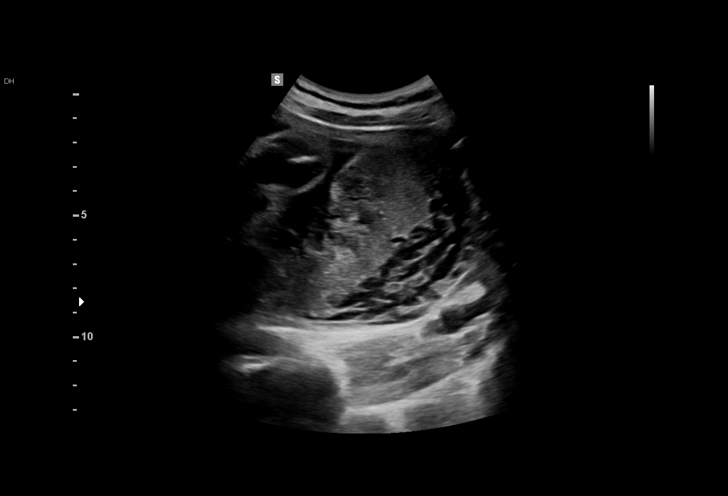
[im 12/47]
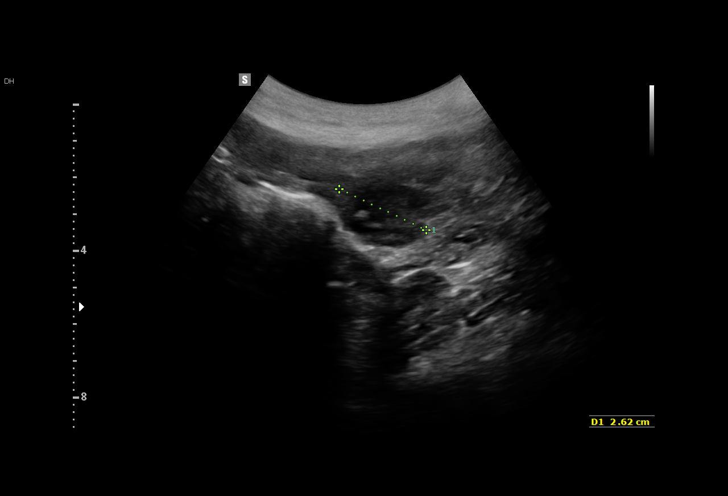
[im 16/47]
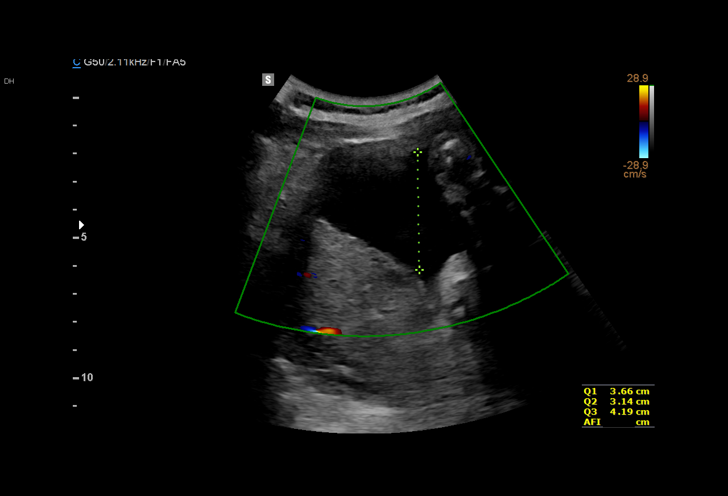
[im 19/47]
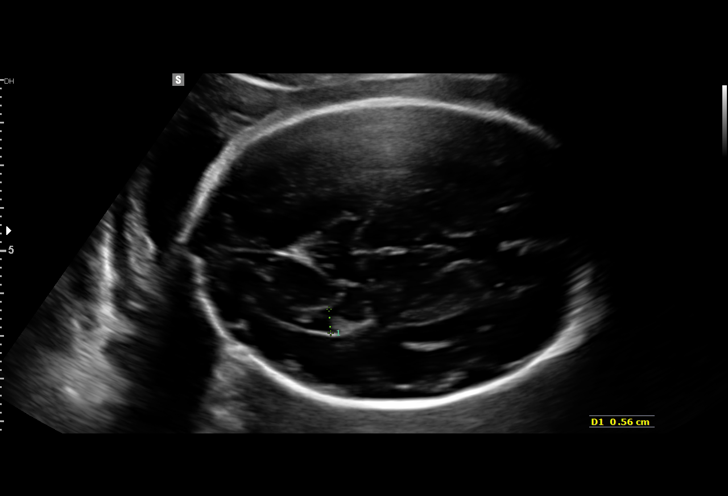
[im 23/47]
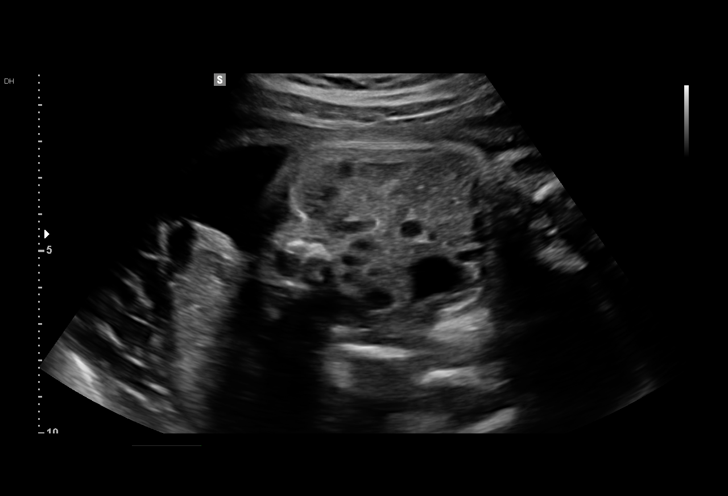
[im 26/47]
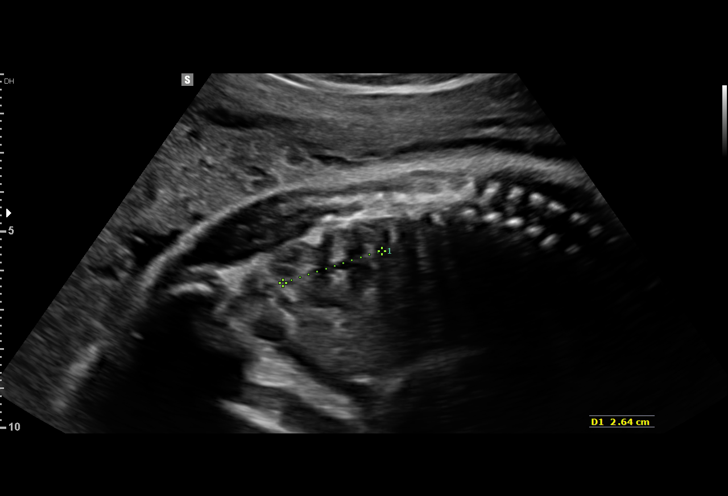
[im 29/47]
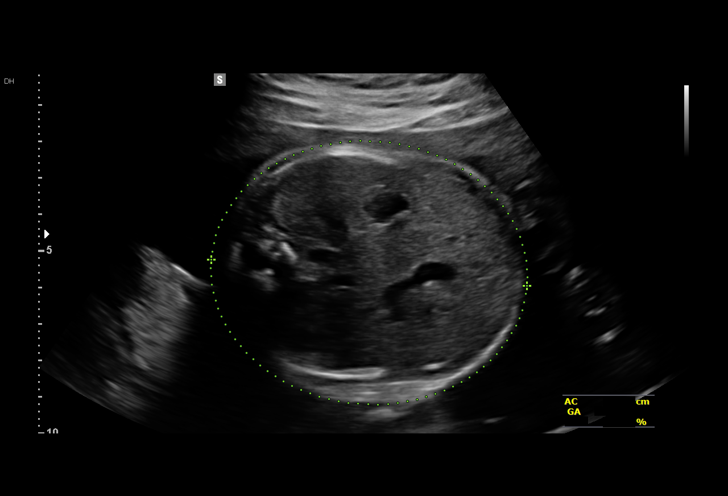
[im 33/47]
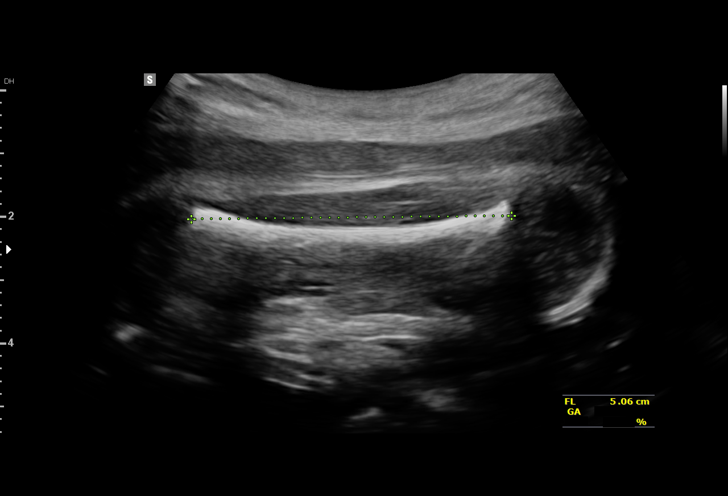
[im 36/47]
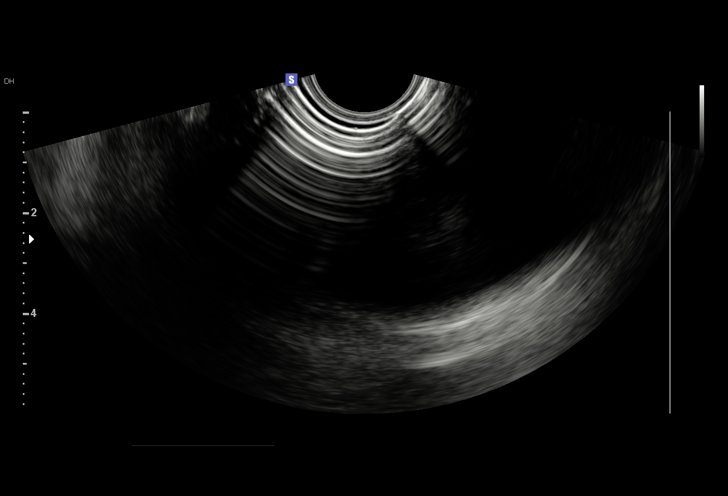
[im 40/47]
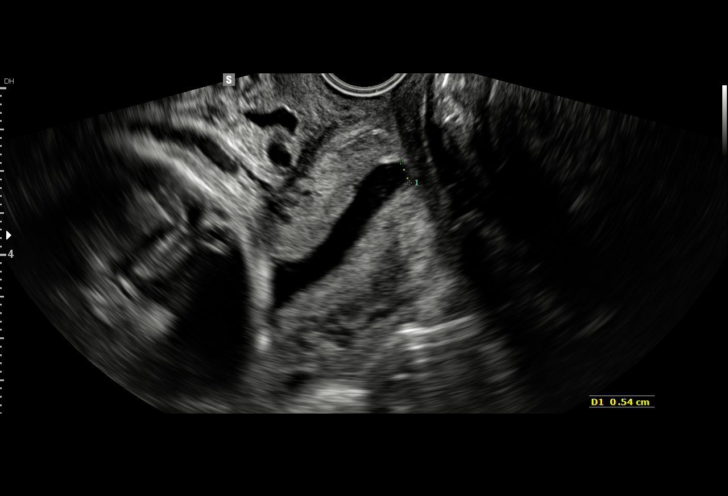
[im 43/47]
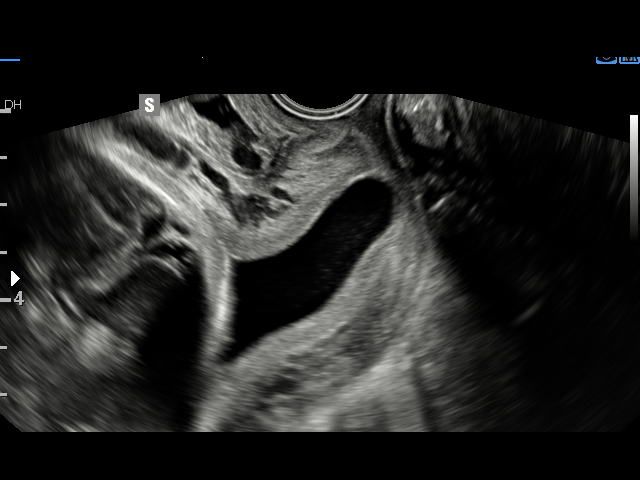
[im 47/47]
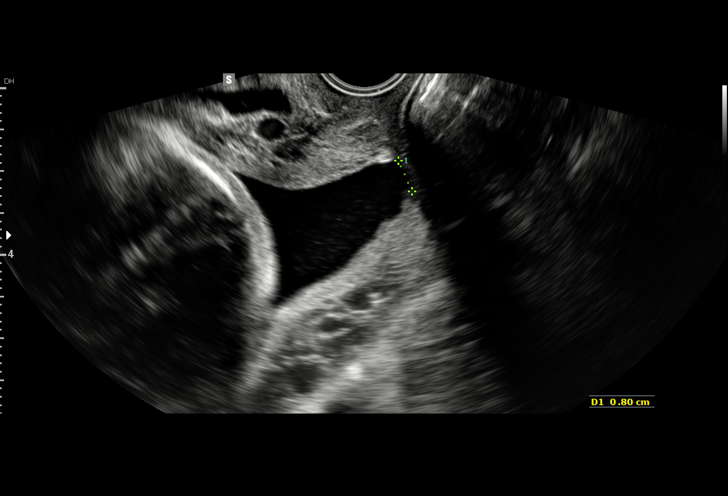

[14 of 28 positions shown; findings below may reference images not displayed]

Attending:        Quirijn Amazigh      Secondary Phy.:   ANTE Nursing-
Antenatal [REDACTED]9

Indications

28 weeks gestation of pregnancy
Cervical shortening, third trimester
BMZ [DATE], [DATE]
OB History

Gravidity:    1         Term:   0        Prem:   0        SAB:   0
TOP:          0       Ectopic:  0        Living: 0
Fetal Evaluation

Num Of Fetuses:     1
Fetal Heart         141
Rate(bpm):
Cardiac Activity:   Observed
Presentation:       Cephalic
Placenta:           Posterior, above cervical os
P. Cord Insertion:  Previously Visualized

Amniotic Fluid
AFI FV:      Subjectively within normal limits

AFI Sum(cm)     %Tile       Largest Pocket(cm)
13.2            38

RUQ(cm)       RLQ(cm)       LUQ(cm)        LLQ(cm)
3.66
Biometry

BPD:      70.9  mm     G. Age:  28w 3d         54  %    CI:        69.79   %    70 - 86
FL/HC:      18.8   %    18.8 -
HC:      270.8  mm     G. Age:  29w 4d         68  %    HC/AC:      1.08        1.05 -
AC:      250.8  mm     G. Age:  29w 2d         79  %    FL/BPD:     71.7   %    71 - 87
FL:       50.8  mm     G. Age:  27w 2d         17  %    FL/AC:      20.3   %    20 - 24
HUM:      47.3  mm     G. Age:  27w 6d         42  %

Est. FW:    2103  gm    2 lb 12 oz      63  %
Gestational Age

LMP:           28w 0d        Date:  04/14/15                 EDD:   01/19/16
U/S Today:     28w 5d                                        EDD:   01/14/16
Best:          28w 0d     Det. By:  LMP  (04/14/15)          EDD:   01/19/16
Anatomy

Cranium:               Appears normal         Aortic Arch:            Previously seen
Cavum:                 Appears normal         Ductal Arch:            Previously seen
Ventricles:            Appears normal         Diaphragm:              Previously seen
Choroid Plexus:        Previously seen        Stomach:                Appears normal, left
sided
Cerebellum:            Previously seen        Abdomen:                Appears normal
Posterior Fossa:       Previously seen        Abdominal Wall:         Previously seen
Nuchal Fold:           Not applicable (>20    Cord Vessels:           Previously seen
wks GA)
Face:                  Orbits and profile     Kidneys:                Appear normal
previously seen
Lips:                  Previously seen        Bladder:                Appears normal
Thoracic:              Appears normal         Spine:                  Previously seen
Heart:                 Appears normal         Upper Extremities:      Previously seen
(4CH, axis, and situs
RVOT:                  Previously seen        Lower Extremities:      Previously seen
LVOT:                  Previously seen

Other:  Fetus appears to be a male. Technically difficult due to fetal position.
Cervix Uterus Adnexa

Cervix
Appears dilated on transvaginal scan

Left Ovary
Within normal limits.

Right Ovary
Within normal limits.

Adnexa:       No abnormality visualized.
Impression

Single living intrauterine pregnancy at 28 weeks 0 days.
Appropriate interval fetal growth (63%).
Normal amniotic fluid volume.
Normal interval fetal anatomy.
No residual cervix is seen on tranvaginal ultrasound.
Fetal membranes are seen within the cervical canal.
The external cervical os appears dilated.
Recommendations

See inpatient consult note.

## 2017-12-29 IMAGING — US US MFM OB TRANSVAGINAL
1 series · 14 of 28 positions shown · non-contrast
Comparison: none

[Series 1: us mfm ob transvaginal · 58 acquisitions, 14 frames shown]
[im 3/58]
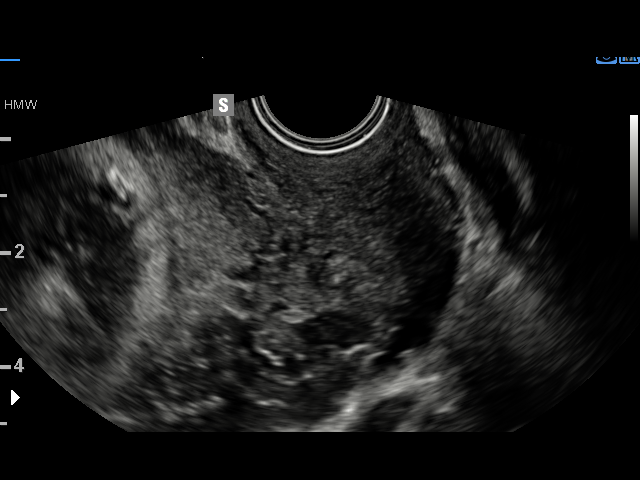
[im 7/58]
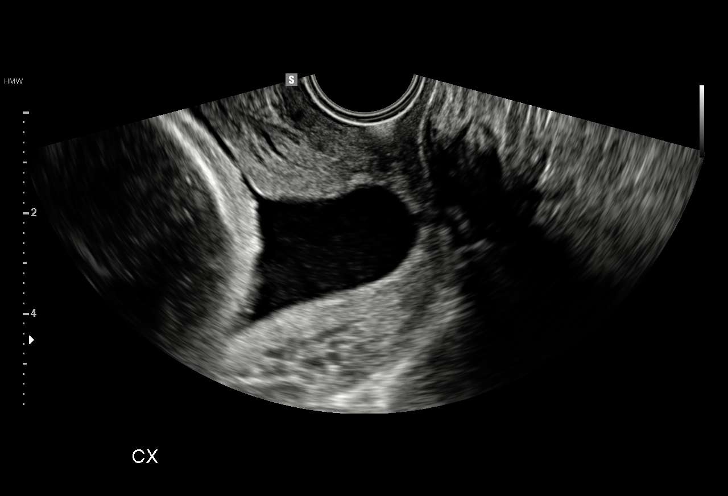
[im 11/58]
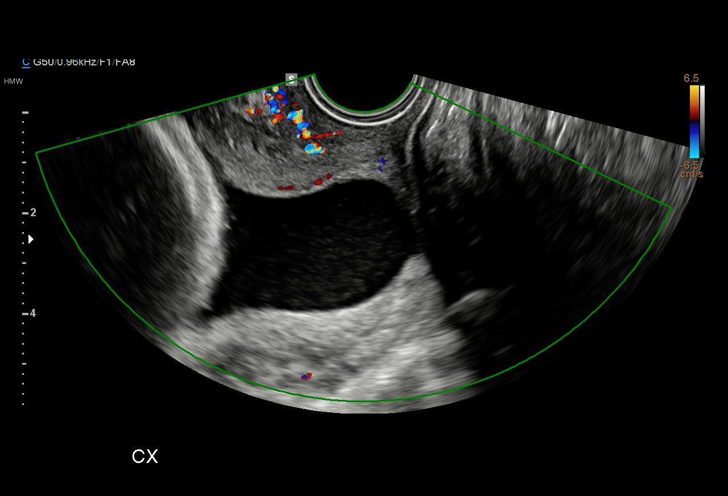
[im 15/58]
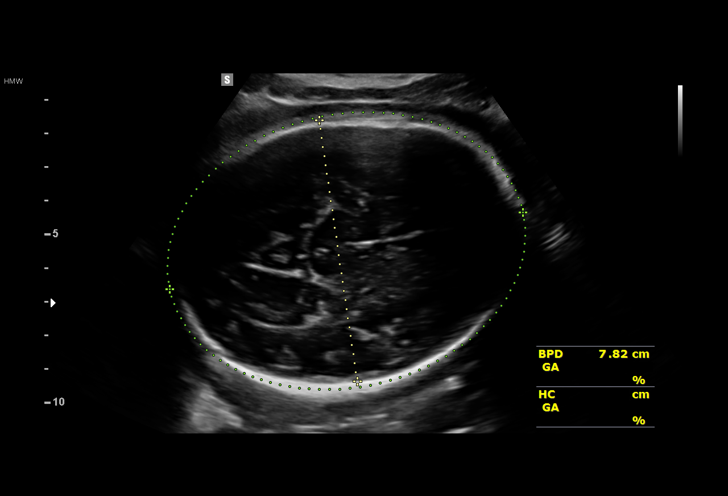
[im 20/58]
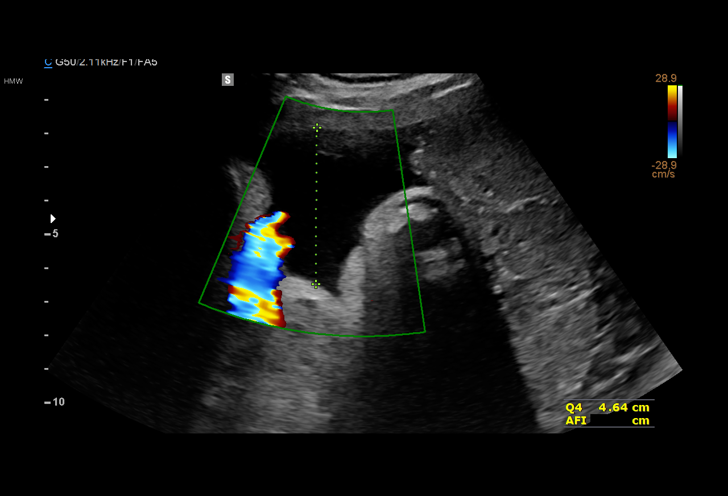
[im 24/58]
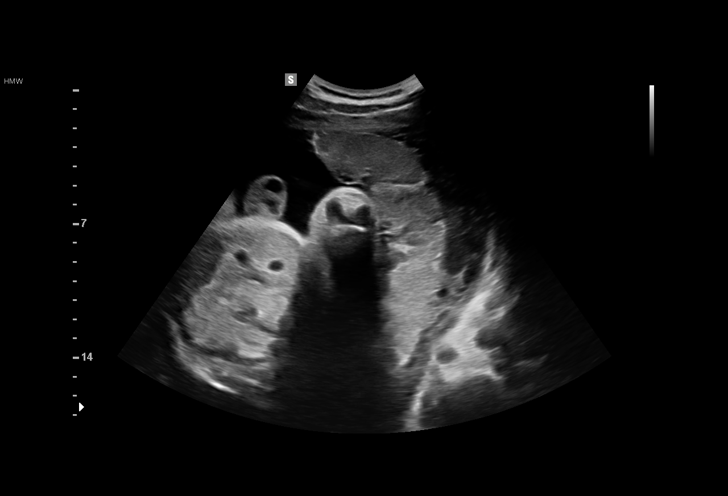
[im 28/58]
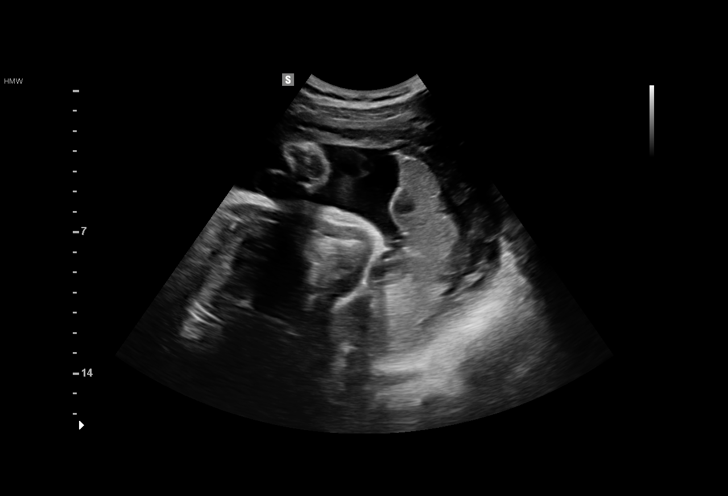
[im 32/58]
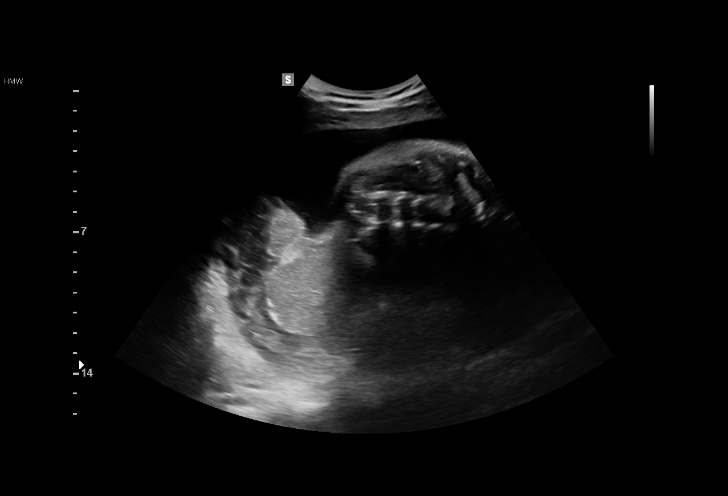
[im 36/58]
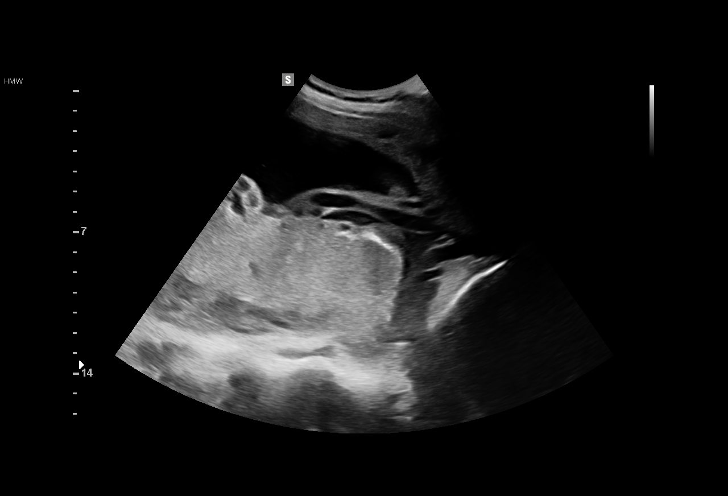
[im 41/58]
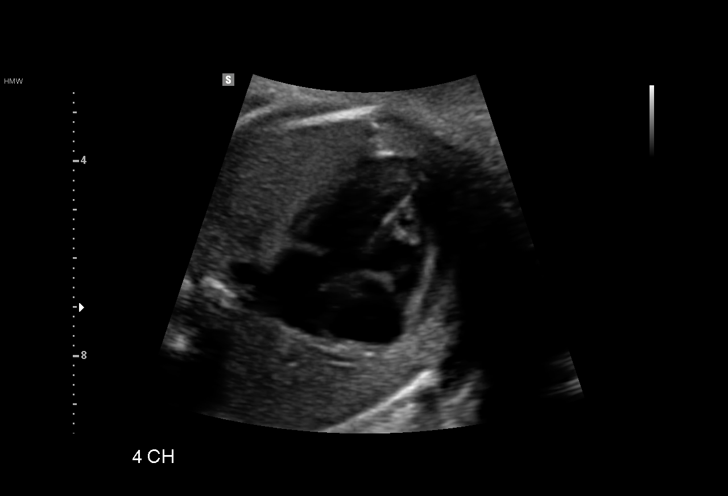
[im 45/58]
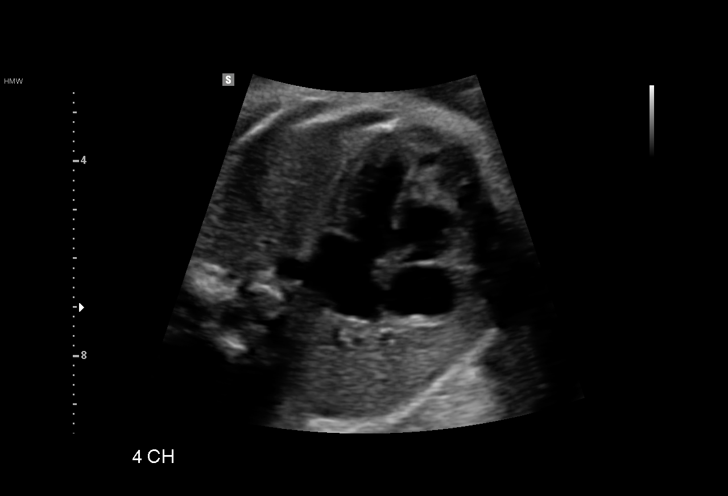
[im 49/58]
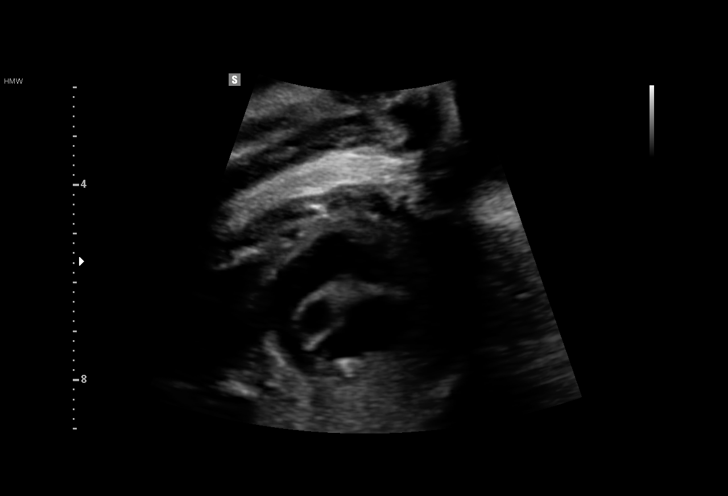
[im 53/58]
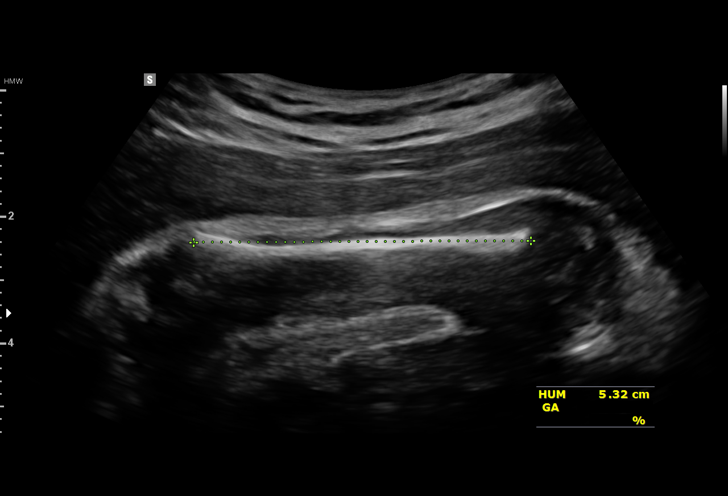
[im 58/58]
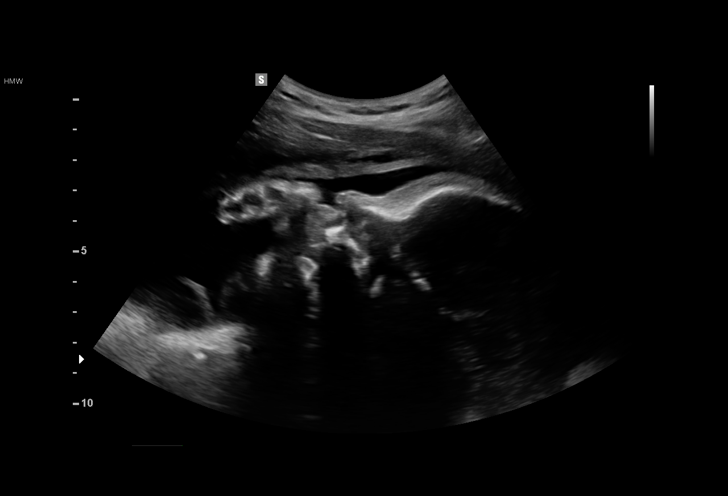

[14 of 28 positions shown; findings below may reference images not displayed]

Antenatal [REDACTED]9

Indications

31 weeks gestation of pregnancy
Cervical shortening, third trimester
BMZ [DATE], [DATE]
OB History

Gravidity:    1         Term:   0        Prem:   0        SAB:   0
TOP:          0       Ectopic:  0        Living: 0
Fetal Evaluation

Num Of Fetuses:     1
Fetal Heart         138
Rate(bpm):
Cardiac Activity:   Observed
Presentation:       Cephalic
Placenta:           Posterior, above cervical os
P. Cord Insertion:  Previously Visualized

Amniotic Fluid
AFI FV:      Subjectively within normal limits

AFI Sum(cm)     %Tile       Largest Pocket(cm)
14.25           49

RUQ(cm)       RLQ(cm)       LUQ(cm)        LLQ(cm)
3.05
Biometry

BPD:      77.7  mm     G. Age:  31w 1d         24  %    CI:        69.39   %   70 - 86
FL/HC:      19.3   %   19.1 -
HC:      297.8  mm     G. Age:  33w 0d         47  %    HC/AC:      1.07       0.96 -
AC:      278.6  mm     G. Age:  32w 0d         54  %    FL/BPD:     74.1   %   71 - 87
FL:       57.6  mm     G. Age:  30w 1d          7  %    FL/AC:      20.7   %   20 - 24
HUM:      53.2  mm     G. Age:  31w 0d         36  %

Est. FW:    7425  gm    3 lb 14 oz      50  %
Gestational Age

LMP:           31w 5d       Date:   04/14/15                 EDD:   01/19/16
U/S Today:     31w 4d                                        EDD:   01/20/16
Best:          31w 5d    Det. By:   LMP  (04/14/15)          EDD:   01/19/16
Anatomy

Cranium:               Appears normal         Aortic Arch:            Previously seen
Cavum:                 Previously seen        Ductal Arch:            Previously seen
Ventricles:            Appears normal         Diaphragm:              Previously seen
Choroid Plexus:        Previously seen        Stomach:                Appears normal, left
sided
Cerebellum:            Previously seen        Abdomen:                Previously seen
Posterior Fossa:       Previously seen        Abdominal Wall:         Previously seen
Nuchal Fold:           Not applicable (>20    Cord Vessels:           Previously seen
wks GA)
Face:                  Orbits and profile     Kidneys:                Appear normal
previously seen
Lips:                  Previously seen        Bladder:                Appears normal
Thoracic:              Appears normal         Spine:                  Previously seen
Heart:                 Appears normal         Upper Extremities:      Previously seen
(4CH, axis, and situs
RVOT:                  Appears normal         Lower Extremities:      Previously seen
LVOT:                  Appears normal

Other:  Fetus appears to be a male. Technically difficult due to fetal position.
Cervix Uterus Adnexa

Cervix
Funneling on transvaginal exam.

Uterus
No abnormality visualized.

Left Ovary
No adnexal mass visualized.

Right Ovary
No adnexal mass visualized.

Cul De Sac:   No free fluid seen.

Adnexa:       No abnormality visualized.
Impression

SIUP at 31+5 weeks
Normal interval anatomy; anatomic survey complete
Normal amniotic fluid volume
Appropriate interval growth with EFW at the 50th %tile
EV views of cervix: funneling to level of external os; no
measurable closed portion; no significant change from most
recent scan on [DATE]; difficult to determine if external os is
dilated - best to perform digital exam
Recommendations

Consider performing digital exam
OK for outpt management

## 2018-01-05 ENCOUNTER — Other Ambulatory Visit: Payer: Self-pay

## 2018-01-05 DIAGNOSIS — N760 Acute vaginitis: Principal | ICD-10-CM

## 2018-01-05 DIAGNOSIS — B9689 Other specified bacterial agents as the cause of diseases classified elsewhere: Secondary | ICD-10-CM

## 2018-01-05 MED ORDER — METRONIDAZOLE 500 MG PO TABS: 500.0000 mg | ORAL_TABLET | Freq: Two times a day (BID) | ORAL | 0 refills | Status: DC

## 2018-01-05 NOTE — Progress Notes (Signed)
Pt called stating she is having symptoms of BV, she just finished her cycle and states this sometimes happens. I sent Flagyl BID 7 days to her pharmacy per protocol. Advised pt to call back if symptoms do not resolve, pt verbalized understanding.

## 2018-01-14 ENCOUNTER — Telehealth: Payer: Self-pay

## 2018-01-14 ENCOUNTER — Other Ambulatory Visit: Payer: Self-pay

## 2018-01-14 DIAGNOSIS — N898 Other specified noninflammatory disorders of vagina: Secondary | ICD-10-CM

## 2018-01-14 MED ORDER — FLUCONAZOLE 150 MG PO TABS: 150.0000 mg | ORAL_TABLET | Freq: Once | ORAL | 0 refills | Status: AC

## 2018-01-14 NOTE — Progress Notes (Unsigned)
TC from pt regarding sx's after completing antibiotic Rx sent per protocol for yeast infection Pt made aware Pt voiced understanding.

## 2018-01-14 NOTE — Telephone Encounter (Signed)
TC to pt regarding pt concerns  Pt was not ava  Left detailed message for pt to call back.

## 2018-01-16 ENCOUNTER — Telehealth: Payer: Self-pay

## 2018-01-16 NOTE — Telephone Encounter (Signed)
TC to pt regarding message Pt requesting another Diflucan Rx Rx was just sent on 01/14/18 Spoke with pharmacy they confirmed pt  Received Rx.  Left detailed message pt needs to allow medication at least 1 week to notice sx's clear if not she will need to schedule an appt.

## 2018-03-23 ENCOUNTER — Telehealth: Payer: Self-pay

## 2018-03-23 DIAGNOSIS — B379 Candidiasis, unspecified: Secondary | ICD-10-CM

## 2018-03-23 MED ORDER — FLUCONAZOLE 150 MG PO TABS: 150.0000 mg | ORAL_TABLET | Freq: Once | ORAL | 0 refills | Status: AC

## 2018-03-23 NOTE — Telephone Encounter (Signed)
Pt called requesting rx for diflucan. Pt states that she is having vaginal itching, and irritation. Per protocol rx sent to pharmacy

## 2018-09-14 ENCOUNTER — Other Ambulatory Visit: Payer: Self-pay | Admitting: Obstetrics

## 2018-09-14 DIAGNOSIS — Z3041 Encounter for surveillance of contraceptive pills: Secondary | ICD-10-CM

## 2018-09-15 ENCOUNTER — Other Ambulatory Visit: Payer: Self-pay

## 2018-09-15 DIAGNOSIS — Z3041 Encounter for surveillance of contraceptive pills: Secondary | ICD-10-CM

## 2018-09-15 MED ORDER — LEVONORGESTREL-ETHINYL ESTRAD 0.15-30 MG-MCG PO TABS: 1.0000 | ORAL_TABLET | Freq: Every day | ORAL | 3 refills | Status: DC

## 2019-07-20 ENCOUNTER — Encounter: Payer: Self-pay | Admitting: Obstetrics

## 2019-07-20 ENCOUNTER — Other Ambulatory Visit (HOSPITAL_COMMUNITY)
Admission: RE | Admit: 2019-07-20 | Discharge: 2019-07-20 | Disposition: A | Discharge status: Home / Self Care | Payer: Medicaid Other | Source: Ambulatory Visit | Attending: Obstetrics | Admitting: Obstetrics

## 2019-07-20 ENCOUNTER — Other Ambulatory Visit: Payer: Self-pay

## 2019-07-20 ENCOUNTER — Ambulatory Visit (INDEPENDENT_AMBULATORY_CARE_PROVIDER_SITE_OTHER): Payer: Medicaid Other | Admitting: Obstetrics

## 2019-07-20 VITALS — BP 136/76 | HR 81 | Wt 140.0 lb

## 2019-07-20 DIAGNOSIS — Z01419 Encounter for gynecological examination (general) (routine) without abnormal findings: Secondary | ICD-10-CM

## 2019-07-20 DIAGNOSIS — Z113 Encounter for screening for infections with a predominantly sexual mode of transmission: Secondary | ICD-10-CM

## 2019-07-20 DIAGNOSIS — Z0000 Encounter for general adult medical examination without abnormal findings: Secondary | ICD-10-CM

## 2019-07-20 DIAGNOSIS — Z3041 Encounter for surveillance of contraceptive pills: Secondary | ICD-10-CM

## 2019-07-20 DIAGNOSIS — N898 Other specified noninflammatory disorders of vagina: Secondary | ICD-10-CM

## 2019-07-20 MED ORDER — LEVONORGESTREL-ETHINYL ESTRAD 0.15-30 MG-MCG PO TABS: 1.0000 | ORAL_TABLET | Freq: Every day | ORAL | 11 refills | Status: DC

## 2019-07-20 NOTE — Progress Notes (Signed)
Subjective:        Catherine Golden is a 24 y.o. female here for a routine exam.  Current complaints: None.    Personal health questionnaire:  Is patient Ashkenazi Jewish, have a family history of breast and/or ovarian cancer: no Is there a family history of uterine cancer diagnosed at age < 41, gastrointestinal cancer, urinary tract cancer, family member who is a Personnel officer syndrome-associated carrier: no Is the patient overweight and hypertensive, family history of diabetes, personal history of gestational diabetes, preeclampsia or PCOS: no Is patient over 63, have PCOS,  family history of premature CHD under age 62, diabetes, smoke, have hypertension or peripheral artery disease:  no At any time, has a partner hit, kicked or otherwise hurt or frightened you?: no Over the past 2 weeks, have you felt down, depressed or hopeless?: no Over the past 2 weeks, have you felt little interest or pleasure in doing things?:no   Gynecologic History No LMP recorded. (Menstrual status: Oral contraceptives). Contraception: OCP (estrogen/progesterone) Last Pap: 09-23-2017. Results were: normal Last mammogram: n/a. Results were: n/a  Obstetric History OB History  Gravida Para Term Preterm AB Living  1 1 1  0 0 1  SAB TAB Ectopic Multiple Live Births  0 0 0 0 1    # Outcome Date GA Lbr Len/2nd Weight Sex Delivery Anes PTL Lv  1 Term 01/20/16 [redacted]w[redacted]d / 00:39 7 lb 5.1 oz (3.32 kg) M Vag-Spont None  LIV    Past Medical History:  Diagnosis Date  . Anemia   . Asthma   . Gonorrhea affecting pregnancy in second trimester 01/20/2016   Had positive GC culture on 09/25/15, treated. Negative TOC on 12/06/15.     Past Surgical History:  Procedure Laterality Date  . WISDOM TOOTH EXTRACTION       Current Outpatient Medications:  .  albuterol (PROVENTIL HFA;VENTOLIN HFA) 108 (90 Base) MCG/ACT inhaler, Inhale 2 puffs into the lungs every 4 (four) hours as needed for wheezing. (Patient not taking: Reported on  04/24/2017), Disp: 2 Inhaler, Rfl: 11 .  erythromycin ophthalmic ointment, Place a 1/2 inch ribbon of ointment into the lower eyelid four times a day., Disp: 3.5 g, Rfl: 0 .  ibuprofen (ADVIL,MOTRIN) 600 MG tablet, Take 1 tablet (600 mg total) by mouth every 6 (six) hours. (Patient not taking: Reported on 04/24/2017), Disp: 30 tablet, Rfl: 0 .  KURVELO 0.15-30 MG-MCG tablet, TAKE 1 TABLET BY MOUTH DAILY, Disp: 28 tablet, Rfl: 11 .  levonorgestrel-ethinyl estradiol (NORDETTE) 0.15-30 MG-MCG tablet, Take 1 tablet by mouth daily., Disp: 1 Package, Rfl: 3 .  MedroxyPROGESTERone Acetate 150 MG/ML SUSY, INJECT CONTENTS OF ONE SYRINGE INTRAMUSCULARLY ONCE EVERY 3 MONTHS (Patient not taking: Reported on 09/23/2017), Disp: 1 mL, Rfl: 3 .  metroNIDAZOLE (FLAGYL) 500 MG tablet, Take 1 tablet (500 mg total) 2 (two) times daily by mouth. (Patient not taking: Reported on 07/17/2017), Disp: 14 tablet, Rfl: 0 .  metroNIDAZOLE (FLAGYL) 500 MG tablet, Take 1 tablet (500 mg total) by mouth 2 (two) times daily., Disp: 14 tablet, Rfl: 0 .  Prenat-Fe Poly-Methfol-FA-DHA (VITAFOL ULTRA) 29-0.6-0.4-200 MG CAPS, Take 1 capsule by mouth at bedtime. , Disp: , Rfl:  .  terconazole (TERAZOL 7) 0.4 % vaginal cream, Place 1 applicator vaginally at bedtime., Disp: 45 g, Rfl: 0 No Known Allergies  Social History   Tobacco Use  . Smoking status: Never Smoker  . Smokeless tobacco: Never Used  Substance Use Topics  . Alcohol use: No  Alcohol/week: 0.0 standard drinks    Family History  Problem Relation Age of Onset  . Hypertension Father   . Diabetes Paternal Grandmother   . Hypertension Paternal Grandmother   . Stroke Paternal Grandmother   . Hypertension Paternal Grandfather   . Cystic fibrosis Paternal Grandfather   . Diabetes Mellitus II Paternal Grandfather   . Cancer Neg Hx       Review of Systems  Constitutional: negative for fatigue and weight loss Respiratory: negative for cough and wheezing Cardiovascular:  negative for chest pain, fatigue and palpitations Gastrointestinal: negative for abdominal pain and change in bowel habits Musculoskeletal:negative for myalgias Neurological: negative for gait problems and tremors Behavioral/Psych: negative for abusive relationship, depression Endocrine: negative for temperature intolerance    Genitourinary:negative for abnormal menstrual periods, genital lesions, hot flashes, sexual problems and vaginal discharge Integument/breast: negative for breast lump, breast tenderness, nipple discharge and skin lesion(s)    Objective:       BP 136/76   Pulse 81   Wt 140 lb (63.5 kg)   BMI 26.45 kg/m  General:   alert  Skin:   no rash or abnormalities  Lungs:   clear to auscultation bilaterally  Heart:   regular rate and rhythm, S1, S2 normal, no murmur, click, rub or gallop  Breasts:   normal without suspicious masses, skin or nipple changes or axillary nodes  Abdomen:  normal findings: no organomegaly, soft, non-tender and no hernia  Pelvis:  External genitalia: normal general appearance Urinary system: urethral meatus normal and bladder without fullness, nontender Vaginal: normal without tenderness, induration or masses Cervix: normal appearance Adnexa: normal bimanual exam Uterus: anteverted and non-tender, normal size   Lab Review Urine pregnancy test Labs reviewed yes Radiologic studies reviewed no  50% of 25 min visit spent on counseling and coordination of care.   Assessment:   1. Encounter for routine gynecological examination with Papanicolaou smear of cervix Rx: - Cytology - PAP  2. Vaginal discharge Rx: - Cervicovaginal ancillary only  3. Routine screening for STI (sexually transmitted infection) Rx: - HIV Antibody (routine testing w rflx) - RPR  4. Encounter for surveillance of contraceptive pills Rx: - levonorgestrel-ethinyl estradiol (KURVELO) 0.15-30 MG-MCG tablet; Take 1 tablet by mouth daily.  Dispense: 28 tablet;  Refill: 11    Plan:    Education reviewed: calcium supplements, depression evaluation, low fat, low cholesterol diet, safe sex/STD prevention, self breast exams and weight bearing exercise. Contraception: OCP (estrogen/progesterone). Follow up in: 1 year.   No orders of the defined types were placed in this encounter.  Orders Placed This Encounter  Procedures  . HIV Antibody (routine testing w rflx)  . RPR    Shelly Bombard, MD 07/20/2019 2:15 PM

## 2019-07-21 LAB — RPR: RPR Ser Ql: NONREACTIVE

## 2019-07-21 LAB — CERVICOVAGINAL ANCILLARY ONLY
Bacterial Vaginitis (gardnerella): NEGATIVE
Candida Glabrata: NEGATIVE
Candida Vaginitis: NEGATIVE
Chlamydia: NEGATIVE
Comment: NEGATIVE
Comment: NEGATIVE
Comment: NEGATIVE
Comment: NEGATIVE
Comment: NEGATIVE
Comment: NORMAL
Neisseria Gonorrhea: NEGATIVE
Trichomonas: NEGATIVE

## 2019-07-21 LAB — HIV ANTIBODY (ROUTINE TESTING W REFLEX): HIV Screen 4th Generation wRfx: NONREACTIVE

## 2019-07-23 LAB — CYTOLOGY - PAP
Comment: NEGATIVE
Diagnosis: UNDETERMINED — AB
High risk HPV: NEGATIVE

## 2019-11-30 ENCOUNTER — Emergency Department (HOSPITAL_COMMUNITY): Payer: Self-pay

## 2019-11-30 ENCOUNTER — Encounter (HOSPITAL_COMMUNITY): Payer: Self-pay | Admitting: Emergency Medicine

## 2019-11-30 ENCOUNTER — Emergency Department (HOSPITAL_COMMUNITY)
Admission: EM | Admit: 2019-11-30 | Discharge: 2019-11-30 | Disposition: A | Discharge status: Home / Self Care | Payer: Self-pay | Attending: Emergency Medicine | Admitting: Emergency Medicine

## 2019-11-30 DIAGNOSIS — Y929 Unspecified place or not applicable: Secondary | ICD-10-CM | POA: Insufficient documentation

## 2019-11-30 DIAGNOSIS — Y939 Activity, unspecified: Secondary | ICD-10-CM | POA: Diagnosis not present

## 2019-11-30 DIAGNOSIS — S0001XA Abrasion of scalp, initial encounter: Secondary | ICD-10-CM | POA: Diagnosis present

## 2019-11-30 DIAGNOSIS — R519 Headache, unspecified: Secondary | ICD-10-CM | POA: Insufficient documentation

## 2019-11-30 DIAGNOSIS — R102 Pelvic and perineal pain: Secondary | ICD-10-CM | POA: Diagnosis not present

## 2019-11-30 DIAGNOSIS — Z7982 Long term (current) use of aspirin: Secondary | ICD-10-CM | POA: Diagnosis not present

## 2019-11-30 DIAGNOSIS — Z793 Long term (current) use of hormonal contraceptives: Secondary | ICD-10-CM | POA: Diagnosis not present

## 2019-11-30 DIAGNOSIS — Y999 Unspecified external cause status: Secondary | ICD-10-CM | POA: Insufficient documentation

## 2019-11-30 MED ORDER — ACETAMINOPHEN 325 MG PO TABS
650.0000 mg | ORAL_TABLET | Freq: Once | ORAL | Status: AC
  Administered 2019-11-30: 650 mg via ORAL
  Filled 2019-11-30: qty 2

## 2019-11-30 MED ORDER — CYCLOBENZAPRINE HCL 5 MG PO TABS: 5.0000 mg | ORAL_TABLET | Freq: Three times a day (TID) | ORAL | 0 refills | Status: DC | PRN

## 2019-11-30 MED ORDER — IBUPROFEN 600 MG PO TABS: 600.0000 mg | ORAL_TABLET | Freq: Four times a day (QID) | ORAL | 0 refills | Status: DC | PRN

## 2019-11-30 MED ORDER — CYCLOBENZAPRINE HCL 10 MG PO TABS
5.0000 mg | ORAL_TABLET | Freq: Once | ORAL | Status: AC
  Administered 2019-11-30: 5 mg via ORAL
  Filled 2019-11-30: qty 1
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
## 2019-11-30 NOTE — Discharge Instructions (Addendum)
Take Motrin for pain and Flexeril for muscle spasms.  Expect to be stiff and sore for several days.  You do have a small abrasion on your scalp so please do not scrub it.   See your doctor for follow-up.  Return to ER if you have worse headache, vomiting, severe back pain or abdominal pain.
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
## 2019-12-05 ENCOUNTER — Other Ambulatory Visit: Payer: Self-pay

## 2019-12-05 ENCOUNTER — Ambulatory Visit (HOSPITAL_COMMUNITY)
Admission: EM | Admit: 2019-12-05 | Discharge: 2019-12-05 | Disposition: A | Discharge status: Home / Self Care | Payer: Medicaid Other | Attending: Emergency Medicine | Admitting: Emergency Medicine

## 2019-12-05 ENCOUNTER — Encounter (HOSPITAL_COMMUNITY): Payer: Self-pay | Admitting: Emergency Medicine

## 2019-12-05 DIAGNOSIS — R519 Headache, unspecified: Secondary | ICD-10-CM

## 2019-12-05 DIAGNOSIS — S161XXA Strain of muscle, fascia and tendon at neck level, initial encounter: Secondary | ICD-10-CM

## 2019-12-05 MED ORDER — NAPROXEN 500 MG PO TABS: 500.0000 mg | ORAL_TABLET | Freq: Two times a day (BID) | ORAL | 0 refills | Status: DC

## 2019-12-05 NOTE — Discharge Instructions (Signed)
Use anti-inflammatories for pain/swelling. You may take up to 800 mg Ibuprofen every 8 hours with food. You may supplement Ibuprofen with Tylenol [phone] mg every 8 hours.  OR naprosyn twice daily with food You may use flexeril as needed to help with pain. This is a muscle relaxer and causes sedation- please use only at bedtime or when you will be home and not have to drive/work  Gentle stretching of neck   Follow up if not improving or worsening
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
## 2020-01-10 ENCOUNTER — Ambulatory Visit (HOSPITAL_COMMUNITY)
Admission: EM | Admit: 2020-01-10 | Discharge: 2020-01-10 | Disposition: A | Discharge status: Home / Self Care | Payer: Medicaid Other | Attending: Family Medicine | Admitting: Family Medicine

## 2020-01-10 ENCOUNTER — Other Ambulatory Visit: Payer: Self-pay

## 2020-01-10 ENCOUNTER — Encounter (HOSPITAL_COMMUNITY): Payer: Self-pay

## 2020-01-10 DIAGNOSIS — R030 Elevated blood-pressure reading, without diagnosis of hypertension: Secondary | ICD-10-CM

## 2020-01-10 DIAGNOSIS — R102 Pelvic and perineal pain: Secondary | ICD-10-CM
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
## 2020-01-10 NOTE — Discharge Instructions (Addendum)
Need to see GYN They should call you within 3 d  With family medicine regarding her blood pressure
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
## 2020-02-03 ENCOUNTER — Telehealth: Payer: Medicaid Other | Admitting: Internal Medicine

## 2020-03-14 ENCOUNTER — Ambulatory Visit (INDEPENDENT_AMBULATORY_CARE_PROVIDER_SITE_OTHER): Payer: Self-pay | Admitting: Obstetrics and Gynecology

## 2020-03-14 ENCOUNTER — Encounter: Payer: Self-pay | Admitting: Obstetrics and Gynecology

## 2020-03-14 DIAGNOSIS — R102 Pelvic and perineal pain: Secondary | ICD-10-CM
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
## 2020-06-19 ENCOUNTER — Other Ambulatory Visit: Payer: Self-pay | Admitting: Obstetrics

## 2020-06-19 DIAGNOSIS — Z3041 Encounter for surveillance of contraceptive pills: Secondary | ICD-10-CM

## 2020-07-21 ENCOUNTER — Ambulatory Visit: Payer: Medicaid Other | Admitting: Obstetrics

## 2020-08-03 ENCOUNTER — Encounter: Payer: Self-pay | Admitting: Obstetrics

## 2020-08-03 ENCOUNTER — Ambulatory Visit (INDEPENDENT_AMBULATORY_CARE_PROVIDER_SITE_OTHER): Payer: BC Managed Care – PPO | Admitting: Obstetrics

## 2020-08-03 ENCOUNTER — Other Ambulatory Visit: Payer: Self-pay

## 2020-08-03 ENCOUNTER — Other Ambulatory Visit (HOSPITAL_COMMUNITY)
Admission: RE | Admit: 2020-08-03 | Discharge: 2020-08-03 | Disposition: A | Discharge status: Home / Self Care | Payer: BC Managed Care – PPO | Source: Ambulatory Visit | Attending: Obstetrics | Admitting: Obstetrics

## 2020-08-03 VITALS — BP 122/68 | HR 89 | Ht 61.0 in | Wt 138.0 lb

## 2020-08-03 DIAGNOSIS — Z01419 Encounter for gynecological examination (general) (routine) without abnormal findings: Secondary | ICD-10-CM | POA: Diagnosis not present

## 2020-08-03 DIAGNOSIS — Z124 Encounter for screening for malignant neoplasm of cervix: Secondary | ICD-10-CM

## 2020-08-03 DIAGNOSIS — Z3009 Encounter for other general counseling and advice on contraception: Secondary | ICD-10-CM

## 2020-08-03 DIAGNOSIS — Z113 Encounter for screening for infections with a predominantly sexual mode of transmission: Secondary | ICD-10-CM | POA: Diagnosis not present

## 2020-08-03 DIAGNOSIS — N898 Other specified noninflammatory disorders of vagina: Secondary | ICD-10-CM

## 2020-08-03 DIAGNOSIS — Z3041 Encounter for surveillance of contraceptive pills: Secondary | ICD-10-CM

## 2020-08-03 MED ORDER — LEVONORGESTREL-ETHINYL ESTRAD 0.15-30 MG-MCG PO TABS: 1.0000 | ORAL_TABLET | Freq: Every day | ORAL | 11 refills | Status: DC

## 2020-08-03 NOTE — Progress Notes (Signed)
Subjective:        Catherine Golden is a 25 y.o. female here for a routine exam.  Current complaints: Vaginal discharge.    Personal health questionnaire:  Is patient Ashkenazi Jewish, have a family history of breast and/or ovarian cancer: no Is there a family history of uterine cancer diagnosed at age < 40, gastrointestinal cancer, urinary tract cancer, family member who is a Personnel officer syndrome-associated carrier: no Is the patient overweight and hypertensive, family history of diabetes, personal history of gestational diabetes, preeclampsia or PCOS: no Is patient over 18, have PCOS,  family history of premature CHD under age 31, diabetes, smoke, have hypertension or peripheral artery disease:  no At any time, has a partner hit, kicked or otherwise hurt or frightened you?: no Over the past 2 weeks, have you felt down, depressed or hopeless?: no Over the past 2 weeks, have you felt little interest or pleasure in doing things?:no   Gynecologic History No LMP recorded. (Menstrual status: Oral contraceptives). Contraception: none Last Pap: 07-20-2019. Results were: ASCUS with negative HRHPV Last mammogram: n/a. Results were: n/a  Obstetric History OB History  Gravida Para Term Preterm AB Living  1 1 1  0 0 1  SAB IAB Ectopic Multiple Live Births  0 0 0 0 1    # Outcome Date GA Lbr Len/2nd Weight Sex Delivery Anes PTL Lv  1 Term 01/20/16 [redacted]w[redacted]d / 00:39 7 lb 5.1 oz (3.32 kg) M Vag-Spont None  LIV    Past Medical History:  Diagnosis Date  . Anemia   . Asthma   . Gonorrhea affecting pregnancy in second trimester 01/20/2016   Had positive GC culture on 09/25/15, treated. Negative TOC on 12/06/15.     Past Surgical History:  Procedure Laterality Date  . WISDOM TOOTH EXTRACTION       Current Outpatient Medications:  .  levonorgestrel-ethinyl estradiol (KURVELO) 0.15-30 MG-MCG tablet, Take 1 tablet by mouth daily., Disp: 28 tablet, Rfl: 11 No Known Allergies  Social History    Tobacco Use  . Smoking status: Never Smoker  . Smokeless tobacco: Never Used  Substance Use Topics  . Alcohol use: No    Alcohol/week: 0.0 standard drinks    Family History  Problem Relation Age of Onset  . Hypertension Father   . Diabetes Paternal Grandmother   . Hypertension Paternal Grandmother   . Stroke Paternal Grandmother   . Hypertension Paternal Grandfather   . Cystic fibrosis Paternal Grandfather   . Diabetes Mellitus II Paternal Grandfather   . Cancer Neg Hx       Review of Systems  Constitutional: negative for fatigue and weight loss Respiratory: negative for cough and wheezing Cardiovascular: negative for chest pain, fatigue and palpitations Gastrointestinal: negative for abdominal pain and change in bowel habits Musculoskeletal:negative for myalgias Neurological: negative for gait problems and tremors Behavioral/Psych: negative for abusive relationship, depression Endocrine: negative for temperature intolerance    Genitourinary:negative for abnormal menstrual periods, genital lesions, hot flashes, sexual problems.  Positive for vaginal discharge Integument/breast: negative for breast lump, breast tenderness, nipple discharge and skin lesion(s)    Objective:       Ht 5\' 1"  (1.549 m)   Wt 138 lb (62.6 kg)   BMI 26.07 kg/m  General:   alert and no distress  Skin:   no rash or abnormalities  Lungs:   clear to auscultation bilaterally  Heart:   regular rate and rhythm, S1, S2 normal, no murmur, click, rub or gallop  Breasts:   normal without suspicious masses, skin or nipple changes or axillary nodes  Abdomen:  normal findings: no organomegaly, soft, non-tender and no hernia  Pelvis:  External genitalia: normal general appearance Urinary system: urethral meatus normal and bladder without fullness, nontender Vaginal: normal without tenderness, induration or masses Cervix: normal appearance Adnexa: normal bimanual exam Uterus: anteverted and non-tender,  normal size   Lab Review Urine pregnancy test Labs reviewed yes Radiologic studies reviewed yes  50% of 20 min visit spent on counseling and coordination of care.   Assessment:     1. Encounter for routine gynecological examination with Papanicolaou smear of cervix Rx: - Cytology - PAP( [facility])  2. Vaginal discharge Rx: - Cervicovaginal ancillary only( [place])  3. Routine screening for STI (sexually transmitted infection) Rx: - HIV Antibody (routine testing w rflx) - RPR  4. Encounter for counseling regarding contraception - wants to restart OCP's  5. Encounter for surveillance of contraceptive pills Rx: - levonorgestrel-ethinyl estradiol (KURVELO) 0.15-30 MG-MCG tablet; Take 1 tablet by mouth daily.  Dispense: 28 tablet; Refill: 11    Plan:    Education reviewed: calcium supplements, depression evaluation, low fat, low cholesterol diet, safe sex/STD prevention, self breast exams and weight bearing exercise. Contraception: OCP (estrogen/progesterone). Follow up in: 1 year.   Meds ordered this encounter  Medications  . levonorgestrel-ethinyl estradiol (KURVELO) 0.15-30 MG-MCG tablet    Sig: Take 1 tablet by mouth daily.    Dispense:  28 tablet    Refill:  11   Orders Placed This Encounter  Procedures  . HIV Antibody (routine testing w rflx)  . RPR    Brock Bad, MD 08/03/2020 1:41 PM

## 2020-08-03 NOTE — Progress Notes (Signed)
Patient presents for Annual Exam today.  LMP:07/30/20 , monthly lasting 4-5 days Heavy Flow.  Contraception: Stopped taking in December wants to restart pill. Last had unprotected intercourse 04/2020.  Last pap:07/20/19 ASCUS  STD Screening: Desires  Family Hx of Breast Cancer: None   CC: 2 lumps in breast pt states she had areas looked at  Already at the [facility].

## 2020-08-04 LAB — CERVICOVAGINAL ANCILLARY ONLY
Bacterial Vaginitis (gardnerella): NEGATIVE
Candida Glabrata: NEGATIVE
Candida Vaginitis: NEGATIVE
Chlamydia: NEGATIVE
Comment: NEGATIVE
Comment: NEGATIVE
Comment: NEGATIVE
Comment: NEGATIVE
Comment: NEGATIVE
Comment: NORMAL
Neisseria Gonorrhea: NEGATIVE
Trichomonas: NEGATIVE

## 2020-08-04 LAB — HIV ANTIBODY (ROUTINE TESTING W REFLEX): HIV Screen 4th Generation wRfx: NONREACTIVE

## 2020-08-04 LAB — RPR: RPR Ser Ql: NONREACTIVE

## 2020-08-07 LAB — CYTOLOGY - PAP: Diagnosis: NEGATIVE

## 2021-03-23 DIAGNOSIS — N760 Acute vaginitis: Secondary | ICD-10-CM | POA: Diagnosis not present

## 2021-03-23 DIAGNOSIS — Z7251 High risk heterosexual behavior: Secondary | ICD-10-CM | POA: Diagnosis not present

## 2021-08-06 ENCOUNTER — Other Ambulatory Visit: Payer: Self-pay

## 2021-08-06 DIAGNOSIS — Z3041 Encounter for surveillance of contraceptive pills: Secondary | ICD-10-CM

## 2021-08-06 MED ORDER — LEVONORGESTREL-ETHINYL ESTRAD 0.15-30 MG-MCG PO TABS: 1.0000 | ORAL_TABLET | Freq: Every day | ORAL | 0 refills | Status: DC

## 2021-09-05 ENCOUNTER — Encounter: Payer: Self-pay | Admitting: Obstetrics

## 2021-09-05 ENCOUNTER — Ambulatory Visit (INDEPENDENT_AMBULATORY_CARE_PROVIDER_SITE_OTHER): Payer: BC Managed Care – PPO | Admitting: Obstetrics

## 2021-09-05 ENCOUNTER — Other Ambulatory Visit: Payer: Self-pay

## 2021-09-05 ENCOUNTER — Other Ambulatory Visit (HOSPITAL_COMMUNITY)
Admission: RE | Admit: 2021-09-05 | Discharge: 2021-09-05 | Disposition: A | Discharge status: Home / Self Care | Payer: BC Managed Care – PPO | Source: Ambulatory Visit | Attending: Obstetrics | Admitting: Obstetrics

## 2021-09-05 VITALS — BP 127/87 | HR 81 | Ht 61.0 in | Wt 137.0 lb

## 2021-09-05 DIAGNOSIS — Z3041 Encounter for surveillance of contraceptive pills: Secondary | ICD-10-CM

## 2021-09-05 DIAGNOSIS — N898 Other specified noninflammatory disorders of vagina: Secondary | ICD-10-CM | POA: Diagnosis not present

## 2021-09-05 DIAGNOSIS — Z01419 Encounter for gynecological examination (general) (routine) without abnormal findings: Secondary | ICD-10-CM | POA: Insufficient documentation

## 2021-09-05 DIAGNOSIS — Z113 Encounter for screening for infections with a predominantly sexual mode of transmission: Secondary | ICD-10-CM | POA: Diagnosis not present

## 2021-09-05 MED ORDER — LEVONORGESTREL-ETHINYL ESTRAD 0.15-30 MG-MCG PO TABS: 1.0000 | ORAL_TABLET | Freq: Every day | ORAL | 11 refills | Status: DC

## 2021-09-05 NOTE — Progress Notes (Signed)
? ?Subjective: ? ? ?  ?  ? Catherine Golden is a 26 y.o. female here for a routine exam.  Current complaints: Vaginal discharge ?.   ? ?Personal health questionnaire:  ?Is patient Ashkenazi Jewish, have a family history of breast and/or ovarian cancer: no ?Is there a family history of uterine cancer diagnosed at age < 2, gastrointestinal cancer, urinary tract cancer, family member who is a Personnel officer syndrome-associated carrier: no ?Is the patient overweight and hypertensive, family history of diabetes, personal history of gestational diabetes, preeclampsia or PCOS: no ?Is patient over 84, have PCOS,  family history of premature CHD under age 33, diabetes, smoke, have hypertension or peripheral artery disease:  no ?At any time, has a partner hit, kicked or otherwise hurt or frightened you?: no ?Over the past 2 weeks, have you felt down, depressed or hopeless?: no ?Over the past 2 weeks, have you felt little interest or pleasure in doing things?:no ? ? ?Gynecologic History ?Patient's last menstrual period was 08/30/2021 (approximate). ?Contraception: OCP (estrogen/progesterone) ?Last Pap: 08/03/2020. Results were: normal ?Last mammogram: n/a. Results were: n/a ? ?Obstetric History ?OB History  ?Gravida Para Term Preterm AB Living  ?1 1 1  0 0 1  ?SAB IAB Ectopic Multiple Live Births  ?0 0 0 0 1  ?  ?# Outcome Date GA Lbr Len/2nd Weight Sex Delivery Anes PTL Lv  ?1 Term 01/20/16 [redacted]w[redacted]d / 00:39 7 lb 5.1 oz (3.32 kg) M Vag-Spont None  LIV  ? ? ?Past Medical History:  ?Diagnosis Date  ? Anemia   ? Asthma   ? Gonorrhea affecting pregnancy in second trimester 01/20/2016  ? Had positive GC culture on 09/25/15, treated. Negative TOC on 12/06/15.   ?  ?Past Surgical History:  ?Procedure Laterality Date  ? WISDOM TOOTH EXTRACTION    ?  ? ?Current Outpatient Medications:  ?  levonorgestrel-ethinyl estradiol (KURVELO) 0.15-30 MG-MCG tablet, Take 1 tablet by mouth daily., Disp: 28 tablet, Rfl: 11 ?No Known Allergies  ?Social History   ? ?Tobacco Use  ? Smoking status: Never  ? Smokeless tobacco: Never  ?Substance Use Topics  ? Alcohol use: No  ?  Alcohol/week: 0.0 standard drinks  ?  ?Family History  ?Problem Relation Age of Onset  ? Hypertension Father   ? Diabetes Paternal Grandmother   ? Hypertension Paternal Grandmother   ? Stroke Paternal Grandmother   ? Hypertension Paternal Grandfather   ? Cystic fibrosis Paternal Grandfather   ? Diabetes Mellitus II Paternal Grandfather   ? Cancer Neg Hx   ?  ? ? ?Review of Systems ? ?Constitutional: negative for fatigue and weight loss ?Respiratory: negative for cough and wheezing ?Cardiovascular: negative for chest pain, fatigue and palpitations ?Gastrointestinal: negative for abdominal pain and change in bowel habits ?Musculoskeletal:negative for myalgias ?Neurological: negative for gait problems and tremors ?Behavioral/Psych: negative for abusive relationship, depression ?Endocrine: negative for temperature intolerance    ?Genitourinary: positive for vaginal discharge.  negative for abnormal menstrual periods, genital lesions, hot flashes, sexual problems  ?Integument/breast: negative for breast lump, breast tenderness, nipple discharge and skin lesion(s) ? ?  ?Objective:  ? ?    ?BP 127/87   Pulse 81   Ht 5\' 1"  (1.549 m)   Wt 137 lb (62.1 kg)   LMP 08/30/2021 (Approximate)   BMI 25.89 kg/m?  ?General:   Alert and no distress  ?Skin:   no rash or abnormalities  ?Lungs:   clear to auscultation bilaterally  ?Heart:   regular rate  and rhythm, S1, S2 normal, no murmur, click, rub or gallop  ?Breasts:   normal without suspicious masses, skin or nipple changes or axillary nodes  ?Abdomen:  normal findings: no organomegaly, soft, non-tender and no hernia  ?Pelvis:  External genitalia: normal general appearance ?Urinary system: urethral meatus normal and bladder without fullness, nontender ?Vaginal: normal without tenderness, induration or masses ?Cervix: normal appearance ?Adnexa: normal bimanual  exam ?Uterus: anteverted and non-tender, normal size  ? ?Lab Review ?Urine pregnancy test ?Labs reviewed yes ?Radiologic studies reviewed no ? ?I have spent a total of 20 minutes of face-to-face45 time, excluding clinical staff time, reviewing notes and preparing to see patient, ordering tests and/or medications, and counseling the patient.  ? ?Assessment:  ? ? 1. Encounter for routine gynecological examination with Papanicolaou smear of cervix ?Rx: ?- Cytology - PAP ? ?2. Vaginal discharge ?Rx: ?- Cervicovaginal ancillary only( [place]) ? ?3. Screen for STD (sexually transmitted disease) ?Rx: ?- HIV antibody (with reflex) ?- Hepatitis B surface antigen ?- RPR ?- Hepatitis C antibody ? ?4. Encounter for surveillance of contraceptive pills ?Rx: ?- levonorgestrel-ethinyl estradiol (KURVELO) 0.15-30 MG-MCG tablet; Take 1 tablet by mouth daily.  Dispense: 28 tablet; Refill: 11  ?  ? ?Plan:  ? ? Education reviewed: calcium supplements, depression evaluation, low fat, low cholesterol diet, safe sex/STD prevention, self breast exams, and weight bearing exercise. ?Contraception: OCP (estrogen/progesterone). ?Follow up in: 1 year.  ? ?Meds ordered this encounter  ?Medications  ? levonorgestrel-ethinyl estradiol (KURVELO) 0.15-30 MG-MCG tablet  ?  Sig: Take 1 tablet by mouth daily.  ?  Dispense:  28 tablet  ?  Refill:  11  ? ?Orders Placed This Encounter  ?Procedures  ? HIV antibody (with reflex)  ? Hepatitis B surface antigen  ? RPR  ? Hepatitis C antibody  ? ? ? Brock Bad, MD ?09/05/2021 11:49 AM  ?

## 2021-09-05 NOTE — Progress Notes (Signed)
GYN, annual ?No vag d/c, itching, no complaints ?Request STI swab and labs ?

## 2021-09-06 LAB — RPR: RPR Ser Ql: NONREACTIVE

## 2021-09-06 LAB — CERVICOVAGINAL ANCILLARY ONLY
Bacterial Vaginitis (gardnerella): NEGATIVE
Candida Glabrata: POSITIVE — AB
Candida Vaginitis: NEGATIVE
Chlamydia: NEGATIVE
Comment: NEGATIVE
Comment: NEGATIVE
Comment: NEGATIVE
Comment: NEGATIVE
Comment: NEGATIVE
Comment: NORMAL
Neisseria Gonorrhea: NEGATIVE
Trichomonas: NEGATIVE

## 2021-09-06 LAB — HEPATITIS C ANTIBODY: Hep C Virus Ab: NONREACTIVE

## 2021-09-06 LAB — HEPATITIS B SURFACE ANTIGEN: Hepatitis B Surface Ag: NEGATIVE

## 2021-09-06 LAB — HIV ANTIBODY (ROUTINE TESTING W REFLEX): HIV Screen 4th Generation wRfx: NONREACTIVE

## 2021-09-08 ENCOUNTER — Other Ambulatory Visit: Payer: Self-pay | Admitting: Obstetrics

## 2021-09-08 DIAGNOSIS — B379 Candidiasis, unspecified: Secondary | ICD-10-CM

## 2021-09-08 MED ORDER — AZO BORIC ACID 600 MG VA SUPP: 1.0000 | Freq: Every day | VAGINAL | 0 refills | Status: DC

## 2021-09-10 LAB — CYTOLOGY - PAP
Comment: NEGATIVE
Diagnosis: UNDETERMINED — AB
High risk HPV: NEGATIVE

## 2021-09-11 ENCOUNTER — Telehealth: Payer: Self-pay

## 2021-09-11 NOTE — Telephone Encounter (Signed)
S/w pt and advised of results and rx sent. 
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
## 2022-04-17 DIAGNOSIS — Z114 Encounter for screening for human immunodeficiency virus [HIV]: Secondary | ICD-10-CM | POA: Diagnosis not present

## 2022-04-17 DIAGNOSIS — Z113 Encounter for screening for infections with a predominantly sexual mode of transmission: Secondary | ICD-10-CM | POA: Diagnosis not present

## 2022-08-02 DIAGNOSIS — Z113 Encounter for screening for infections with a predominantly sexual mode of transmission: Secondary | ICD-10-CM | POA: Diagnosis not present

## 2022-09-24 ENCOUNTER — Other Ambulatory Visit (HOSPITAL_COMMUNITY)
Admission: RE | Admit: 2022-09-24 | Discharge: 2022-09-24 | Disposition: A | Discharge status: Home / Self Care | Payer: BC Managed Care – PPO | Source: Ambulatory Visit | Attending: Obstetrics & Gynecology | Admitting: Obstetrics & Gynecology

## 2022-09-24 ENCOUNTER — Ambulatory Visit (INDEPENDENT_AMBULATORY_CARE_PROVIDER_SITE_OTHER): Payer: BC Managed Care – PPO | Admitting: Obstetrics & Gynecology

## 2022-09-24 ENCOUNTER — Encounter: Payer: Self-pay | Admitting: Obstetrics & Gynecology

## 2022-09-24 VITALS — BP 129/83 | HR 80 | Wt 136.0 lb

## 2022-09-24 DIAGNOSIS — Z01419 Encounter for gynecological examination (general) (routine) without abnormal findings: Secondary | ICD-10-CM | POA: Diagnosis not present

## 2022-09-24 DIAGNOSIS — Z1339 Encounter for screening examination for other mental health and behavioral disorders: Secondary | ICD-10-CM | POA: Diagnosis not present

## 2022-09-24 NOTE — Progress Notes (Unsigned)
Pt request STD screening today. Pt uses Condoms for birth control. Pt states her cycles are heavy, lasting about 7 days.

## 2022-09-24 NOTE — Progress Notes (Signed)
Patient ID: Catherine Golden, female   DOB: 06/19/1996, 27 y.o.   MRN: [id number]  Chief Complaint  Patient presents with   Annual Exam    HPI Catherine Golden is a 27 y.o. female.  [id number] Patient's last menstrual period was 09/04/2022. She uses condoms for [facility]  HPI  Past Medical History:  Diagnosis Date   Anemia    Asthma    Gonorrhea affecting pregnancy in second trimester 01/20/2016   Had positive GC culture on 09/25/15, treated. Negative TOC on 12/06/15.     Past Surgical History:  Procedure Laterality Date   WISDOM TOOTH EXTRACTION      Family History  Problem Relation Age of Onset   Hypertension Father    Diabetes Paternal Grandmother    Hypertension Paternal Grandmother    Stroke Paternal Grandmother    Hypertension Paternal Grandfather    Cystic fibrosis Paternal Grandfather    Diabetes Mellitus II Paternal Grandfather    Cancer Neg Hx     Social History Social History   Tobacco Use   Smoking status: Never   Smokeless tobacco: Never  Substance Use Topics   Alcohol use: No    Alcohol/week: 0.0 standard drinks of alcohol   Drug use: No    No Known Allergies  Current Outpatient Medications  Medication Sig Dispense Refill   levonorgestrel-ethinyl estradiol (KURVELO) 0.15-30 MG-MCG tablet Take 1 tablet by mouth daily. (Patient not taking: Reported on 09/24/2022) 28 tablet 11   No current facility-administered medications for this visit.    Review of Systems Review of Systems  Constitutional: Negative.   Respiratory: Negative.    Gastrointestinal: Negative.   Endocrine: Negative.   Genitourinary:  Positive for menstrual problem (flow for 7 days o/w regular).    Blood pressure 129/83, pulse 80, weight 136 lb (61.7 kg), last menstrual period 09/04/2022.  Physical Exam Physical Exam Vitals and nursing note reviewed. Exam conducted with a chaperone present.  Constitutional:      Appearance: Normal appearance. She is not ill-appearing.  HENT:      Head: Normocephalic and atraumatic.  Cardiovascular:     Rate and Rhythm: Normal rate.  Pulmonary:     Effort: Pulmonary effort is normal.  Chest:  Breasts:    Right: Normal.     Left: Normal.  Abdominal:     General: Abdomen is flat.     Palpations: Abdomen is soft.  Genitourinary:    General: Normal vulva.     Exam position: Lithotomy position.     Vagina: Normal.     Cervix: Normal.     Uterus: Normal.      Adnexa: Right adnexa normal and left adnexa normal.  Musculoskeletal:     Cervical back: Normal range of motion.  Skin:    General: Skin is warm and dry.  Neurological:     Mental Status: She is alert.  Psychiatric:        Mood and Affect: Mood normal.        Behavior: Behavior normal.     Data Reviewed Pap 08/2021 ASCUS neg HR HPV. She had been advised to repeat in 12 months  Assessment Well woman exam with routine gynecological exam   Plan If pap is nl may repeat in 3 years I gave her information on birth control methods    Emeterio Reeve 09/24/2022, 11:38 AM

## 2022-09-25 LAB — CERVICOVAGINAL ANCILLARY ONLY
Chlamydia: NEGATIVE
Comment: NEGATIVE
Comment: NORMAL
Neisseria Gonorrhea: NEGATIVE

## 2022-09-26 LAB — CYTOLOGY - PAP
Adequacy: ABSENT
Diagnosis: NEGATIVE

## 2023-01-01 ENCOUNTER — Other Ambulatory Visit: Payer: Self-pay | Admitting: Obstetrics

## 2023-01-01 DIAGNOSIS — Z3041 Encounter for surveillance of contraceptive pills: Secondary | ICD-10-CM

## 2023-01-07 ENCOUNTER — Other Ambulatory Visit: Payer: Self-pay

## 2023-01-07 DIAGNOSIS — Z3041 Encounter for surveillance of contraceptive pills: Secondary | ICD-10-CM

## 2023-02-05 DIAGNOSIS — N760 Acute vaginitis: Secondary | ICD-10-CM | POA: Diagnosis not present

## 2023-02-05 DIAGNOSIS — Z114 Encounter for screening for human immunodeficiency virus [HIV]: Secondary | ICD-10-CM | POA: Diagnosis not present

## 2023-02-05 DIAGNOSIS — Z113 Encounter for screening for infections with a predominantly sexual mode of transmission: Secondary | ICD-10-CM | POA: Diagnosis not present

## 2023-02-05 DIAGNOSIS — A5901 Trichomonal vulvovaginitis: Secondary | ICD-10-CM | POA: Diagnosis not present

## 2023-12-23 ENCOUNTER — Other Ambulatory Visit: Payer: Self-pay | Admitting: Family Medicine

## 2023-12-23 DIAGNOSIS — Z3041 Encounter for surveillance of contraceptive pills: Secondary | ICD-10-CM

## 2023-12-30 ENCOUNTER — Other Ambulatory Visit: Payer: Self-pay

## 2023-12-30 DIAGNOSIS — Z3041 Encounter for surveillance of contraceptive pills: Secondary | ICD-10-CM

## 2023-12-30 MED ORDER — LEVONORGESTREL-ETHINYL ESTRAD 0.15-30 MG-MCG PO TABS: 1.0000 | ORAL_TABLET | Freq: Every day | ORAL | 0 refills | Status: AC

## 2024-01-14 ENCOUNTER — Ambulatory Visit: Admitting: Physician Assistant
